# Patient Record
Sex: Female | Born: 1995 | Marital: Single
Health system: Northeastern US, Academic
[De-identification: ages and names within clinical notes are randomized; demographics above are authoritative.]

## PROBLEM LIST (undated history)

## (undated) HISTORY — PX: SHOULDER SURGERY: SHX246

## (undated) HISTORY — PX: KNEE SURGERY: SHX244

---
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
## 2014-09-12 IMAGING — CT CT ABD-PELV W/ CM
2 of 4 series · 17 of 46 positions shown, 19 images · IV contrast (APPLIED)
Comparison: None.

CLINICAL DATA: Right lower quadrant pain, nausea and vomiting

EXAM:
CT ABDOMEN AND PELVIS WITH CONTRAST
TECHNIQUE: Multidetector CT imaging of the abdomen and pelvis was performed
using the standard protocol following bolus administration of
intravenous contrast.
CONTRAST:  50mL OMNIPAQUE IOHEXOL 300 MG/ML SOLN, 100mL OMNIPAQUE
IOHEXOL 300 MG/ML SOLN

[Series 2: abd/pelvis 5.0 b31f · axial · 0.83mm/px · z∈[-561,-121]mm · 14 of 96 slices shown, 16 images]
[im 4/96  soft-tissue]
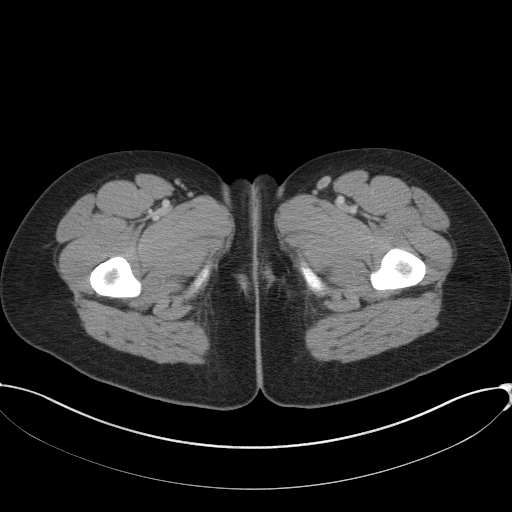
[im 4/96  bone]
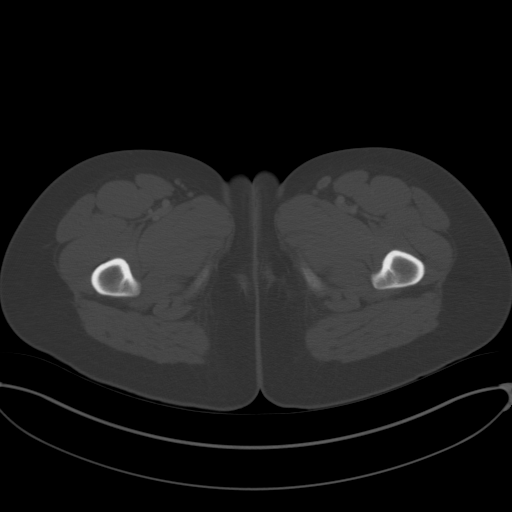
[im 12/96  soft-tissue]
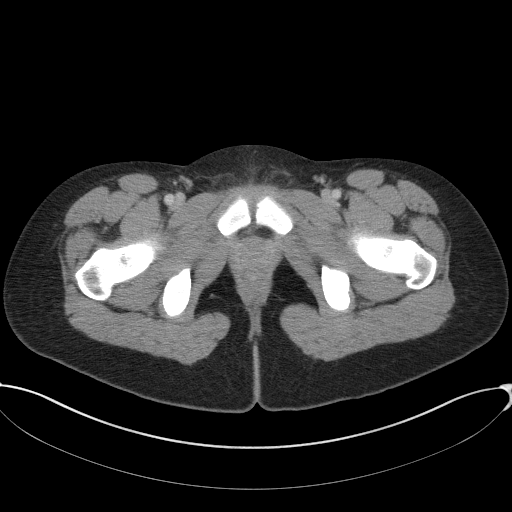
[im 20/96  soft-tissue]
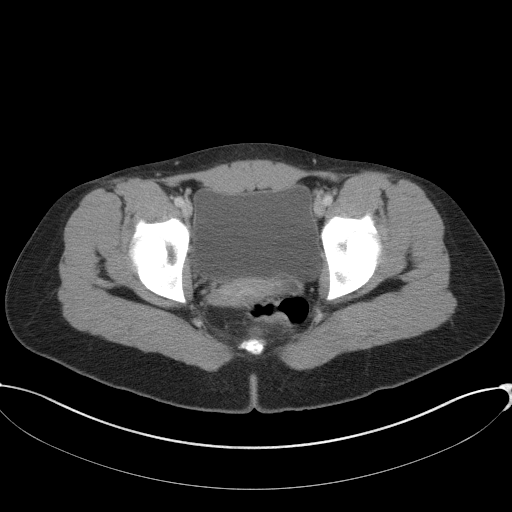
[im 24/96  soft-tissue]
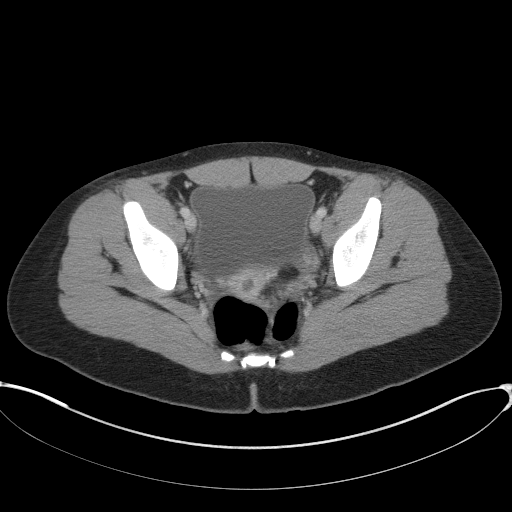
[im 32/96  soft-tissue]
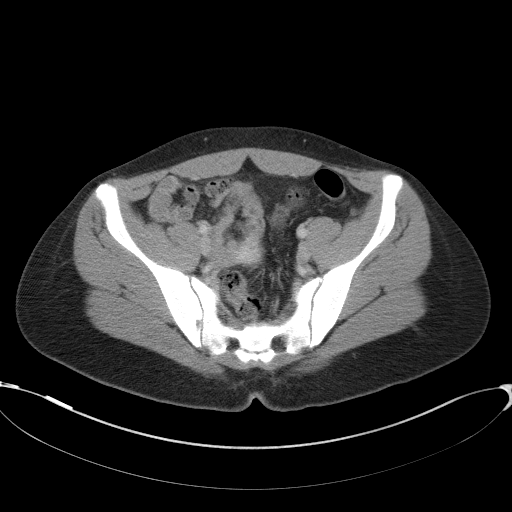
[im 40/96  soft-tissue]
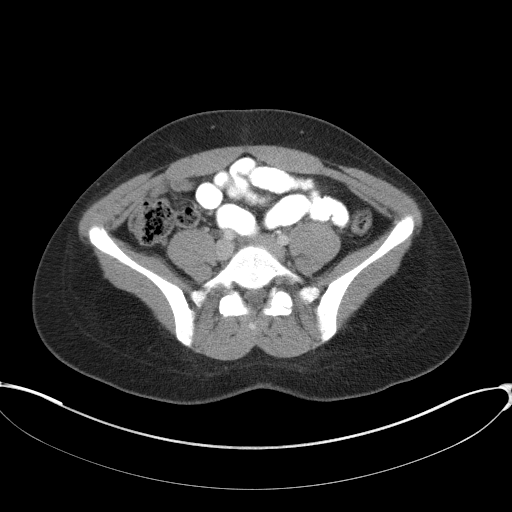
[im 44/96  soft-tissue]
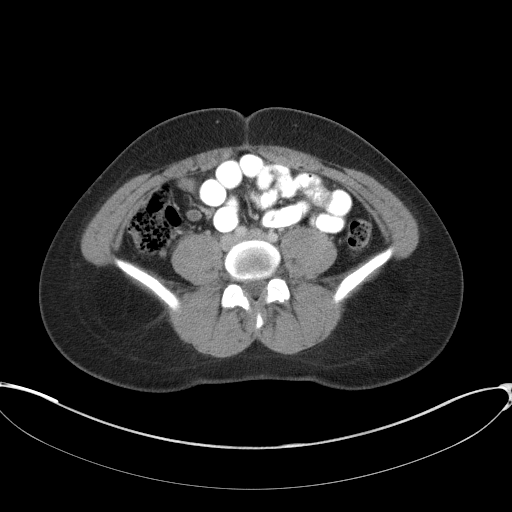
[im 52/96  soft-tissue]
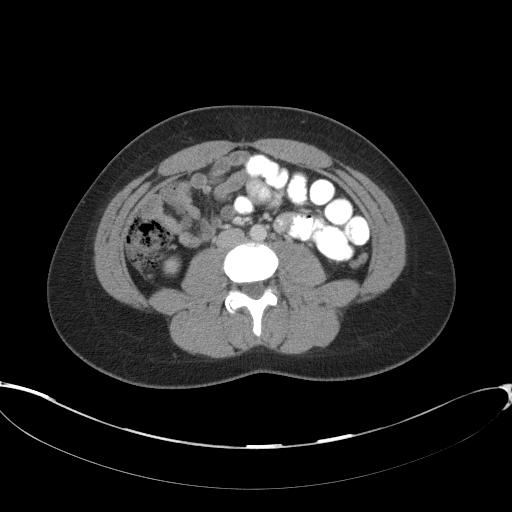
[im 56/96  soft-tissue]
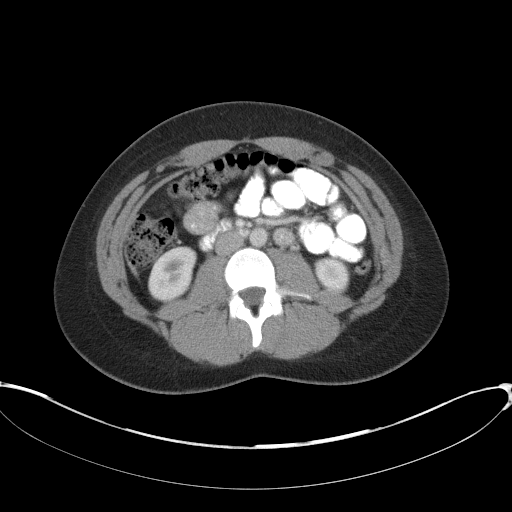
[im 56/96  bone]
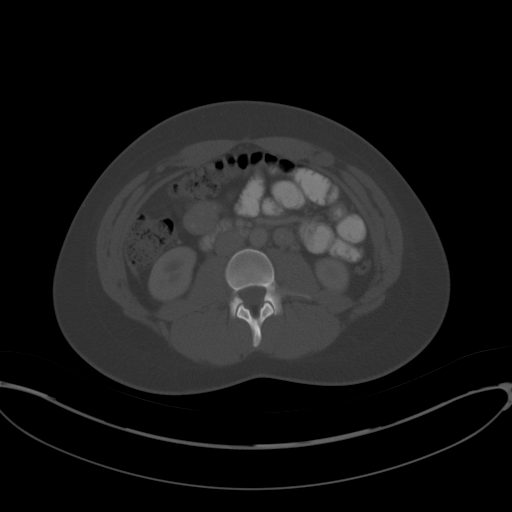
[im 64/96  soft-tissue]
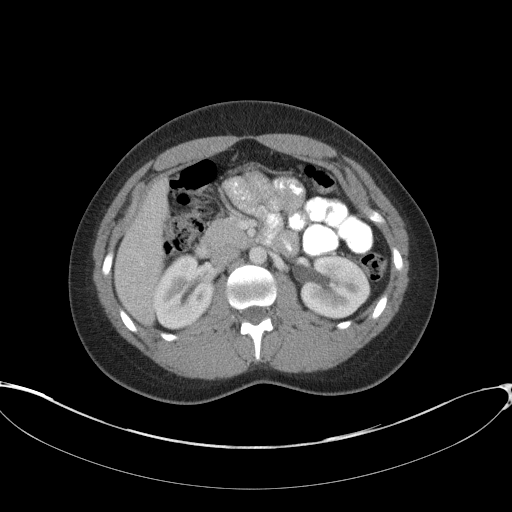
[im 72/96  soft-tissue]
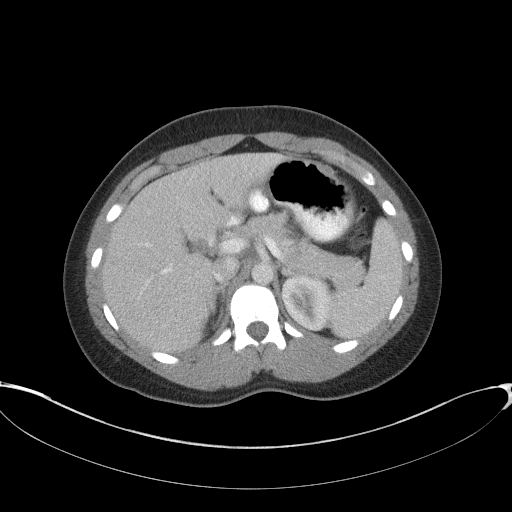
[im 76/96  soft-tissue]
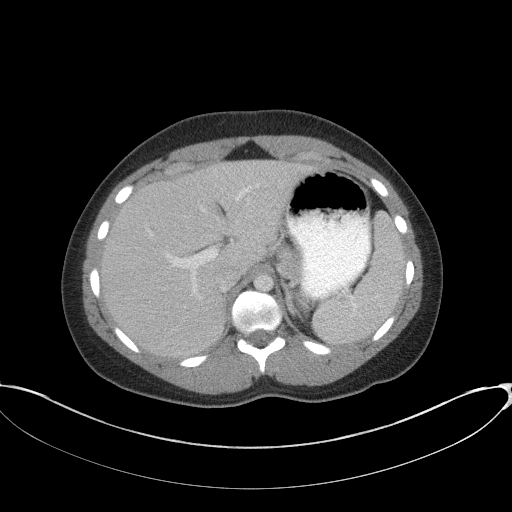
[im 84/96  soft-tissue]
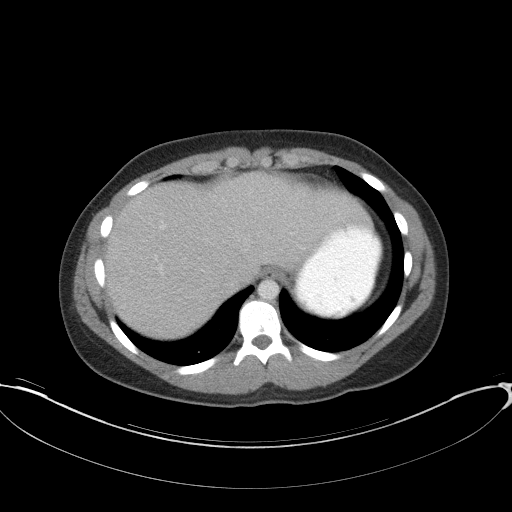
[im 92/96  soft-tissue]
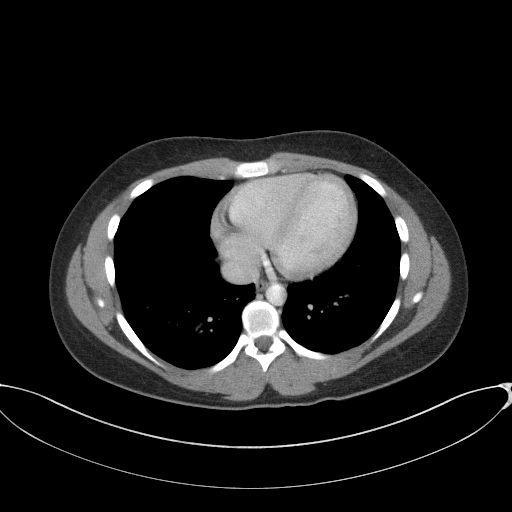

[Series 5: abd/pelvis 3.0 coronal · coronal · 0.86mm/px · 3 of 78 slices shown]
[im 26/78  soft-tissue]
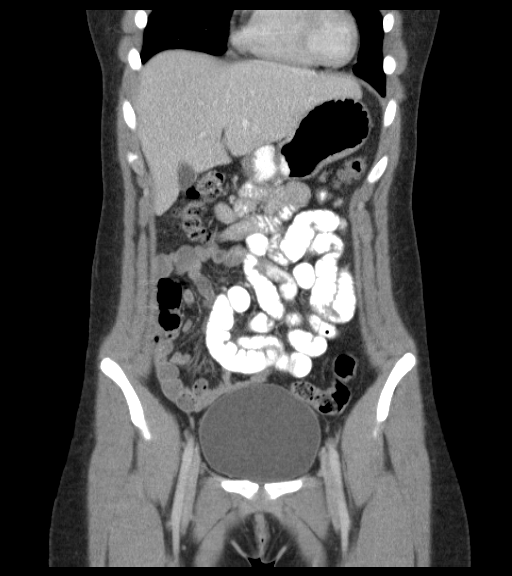
[im 35/78  soft-tissue]
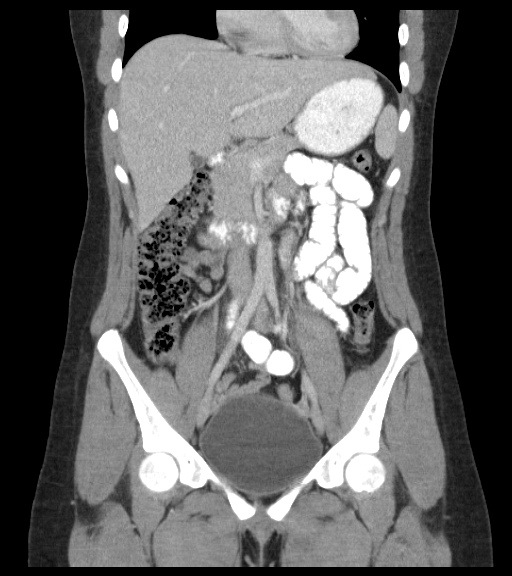
[im 43/78  soft-tissue]
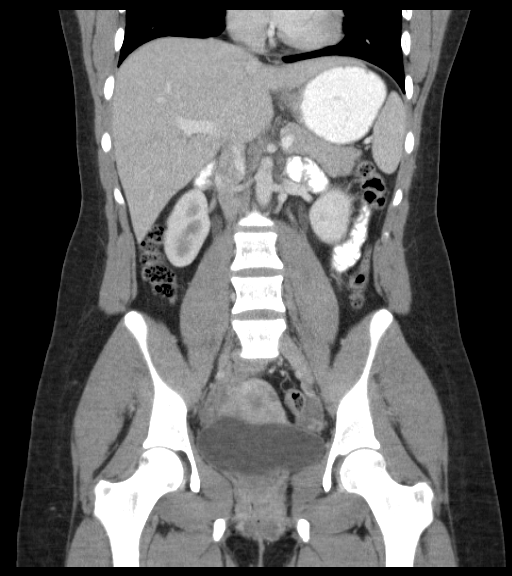

[17 of 46 positions shown; findings below may reference images not displayed]

FINDINGS: Lung bases are clear.  No pericardial fluid.

The liver, gallbladder, pancreas, spleen, adrenal glands, and
kidneys are normal.

The stomach, small bowel, appendix, and cecum are normal. The colon
rectosigmoid colon are normal.

Abdominal aorta is no caliber. No retroperitoneal periportal
lymphadenopathy.

No free fluid the pelvis. Uterus and ovaries are normal. Bladder is
normal. No pelvic lymphadenopathy. No aggressive osseous lesion.
IMPRESSION: Normal appendix.  No acute abdominal or pelvic trauma.

## 2016-01-31 ENCOUNTER — Ambulatory Visit: Admitting: Surgery

## 2016-01-31 ENCOUNTER — Ambulatory Visit

## 2016-01-31 NOTE — Progress Notes (Signed)
.  Progress Notes  .  Patient: [name] [name]  Provider: Biagio Borg  DOB: 1996-02-17 Age: 20 Y Sex: Female  .  PCP: Zonia Kief  MD  Date: 01/31/2016  .  --------------------------------------------------------------------------------  .  REASON FOR APPOINTMENT  .  1. She relates that the hallux nail of her left great toe has  fallen off twice and keeps regrowing. she denies any injury to  the toe  .  CURRENT MEDICATIONS  .  Taking Clonazepam 0.5 MG Tablet 1 tablet Once a day  Taking Sertraline HCl 25 mg Tablet 1 tablet Once a day  Medication List reviewed and reconciled with the patient  .  PAST MEDICAL HISTORY  .  Celiac disease - adherent to gluten free diet  Depression/anxiety - diagnosed during Freshman year of college,  never on medication  Left shoulder labral tear  Left knee injury - medial and lateral menisus tears, ACL tear  Chondromalacia patellae  Right ankle sprain  .  ALLERGIES  .  N.K.D.A.  .  SURGICAL HISTORY  .  Left ACL tear, lateral and medial meniscus injury repair. Feb  2013  Left shoulder labral repair 05/14  Left medial meniscus repair 06/15  .  FAMILY HISTORY  .  Mother: alive, diagnosed with Hypertension  Father: alive  Maternal Grand Mother: Depression  Maternal Grand Father: deceased, diagnosed with Hypertension,  Diabetes  Siblings: Sister - depression, anxiety Twin brothers, healthy  No known hx of autoimmune diseases.Denies early CAD, CVA, thyroid  d/o, substance abuseDenies h/o breast, colon, ovarian, skin  cancers.  .  SOCIAL HISTORY  .  .  Tobaccohistory:Never smoked.  .  Military HistoryHave you or anyone in your family served in the  [employer] in any capacity?Yes , If YES, describe whosibling.  .  Depression ScreeningDepression Screening FindingsNegative.  .  Sexual history Never sexually active.  .  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  .  Exercise: Was competing in [employer], exercising daily.  .  Alcohol Socially 1 glass, once/month.  .  Illicit drugs:  Denies.  .  Seat Belts: always.  .  Lives with: Parents.  .  Feels safe in her homeDenies guns in the homeGymnastics and  rowing while in high school.  Marland Kitchen  HOSPITALIZATION/MAJOR DIAGNOSTIC PROCEDURE  .  No Hospitalization History.  Marland Kitchen  VITAL SIGNS  .  Pain scale 1, Ht-in 66.34, Wt-lbs 165, BMI 26.36, BSA 1.87, Ht-cm  168.5, Wt-kg 74.84, Wt Change -3 lb.  Marland Kitchen  EXAMINATION  .  GENERAL: palpable pedal pulses and neurologic  sensation intact. skin is intact with no fissures, ulcerations,  or abscesses. there is a dry, scaly eruption of the left foot,  symptoms consistent with tinea pedis. there is subungal debris  and mild thickening of the left hallux nail. there is no  incurvation of the nail at this date.  .  ASSESSMENTS  .  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  Tinea pedis of left foot - B35.3  .  Foot pain, left - M79.672  .  TREATMENT  .  Dermatophytosis of nail  Start Econazole Nitrate Cream, 1 %, 1 application to affected  area, Externally, Once a day  Notes: inspected feet and provided patient with a prescription  for econazole cream. discussed with patient that her nail is  likely falling off because of the fungus under her nails.  instructed patient to apply econazole around the nails to treat  fungus. instructed patient on  proper home care.  .  FOLLOW UP  .  2 Months  .  Electronically signed by Victorio Palm , DPM on  02/03/2016 at 07:09 AM EDT  .  Document electronically signed by Biagio Borg

## 2016-01-31 NOTE — Progress Notes (Signed)
* * *        **[name] [name], [name] [name]**    --- ---    54 Y old Female, DOB: [birth year], External MRN: [id number]    Account Number: [id number]    2 VERA [name] [name], [id number]    Home: [phone]    Insurance: [employer] Gunning PPO Payer ID: PAPER    PCP: [name] Kief, MD Referring: Naida Sleight External Visit ID: [id number]    [facility]        * * *    01/31/2016  Progress Notes: [name] [name], DPM **CHN#:** [id number]    --- ---    ---        Current Medications    ---    Taking     * Clonazepam 0.5 MG Tablet 1 tablet Once a day    ---    * Sertraline HCl 25 mg Tablet 1 tablet Once a day    ---    * Medication List reviewed and reconciled with the patient    ---      Past Medical History    ---       Celiac disease - adherent to gluten free diet.        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        ---    Left shoulder labral tear.        ---    Left knee injury - medial and lateral menisus tears, ACL tear.        ---    Chondromalacia patellae.        ---    Right ankle sprain.        ---      Surgical History    ---      Left ACL tear, lateral and medial meniscus injury repair. Feb 2013    ---    Left shoulder labral repair 05/14    ---    Left medial meniscus repair 06/15    ---      Family History    ---      Mother: alive, diagnosed with Hypertension    ---    Father: alive    ---    Maternal Grand Mother: Depression    ---    Maternal Grand Father: deceased, diagnosed with Hypertension, Diabetes    ---    Siblings: Sister - depression, anxiety Twin brothers, healthy    ---    No known hx of autoimmune diseases.    Denies early CAD, CVA, thyroid d/o, substance abuse    Denies h/o breast, colon, ovarian, skin cancers.    ---      Social History    ---    Tobacco history: Never smoked.    Military History Have you or anyone in your family served in the [employer] in  any capacity? Yes, If YES, describe who sibling.    Depression Screening  Depression Screening Findings Negative.    Sexual history  Never sexually active.    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    Exercise: Was competing in [employer], exercising daily.    Alcohol  Socially 1 glass, once/month.    Illicit drugs: Denies.    Seat Belts: always.    Lives with: Parents.   Feels safe in her home    Denies guns in the home    Gymnastics and rowing while in high school.    ---  Allergies    ---      N.K.D.A.    ---    [name] [name] Verified]      Hospitalization/Major Diagnostic Procedure    ---      No Hospitalization History.    ---        Reason for Appointment    ---      1\. She relates that the hallux nail of her left great toe has fallen off  twice and keeps regrowing. she denies any injury to the toe    ---      Vital Signs    ---    Pain scale 1, Ht-in 66.34, Wt-lbs 165, BMI 26.36, BSA 1.87, Ht-cm 168.5, Wt-kg  74.84, Wt Change -3 lb.      Examination    ---     _GENERAL_ :    palpable pedal pulses and neurologic sensation intact. skin is intact with no  fissures, ulcerations, or abscesses. there is a dry, scaly eruption of the  left foot, symptoms consistent with tinea pedis. there is subungal debris and  mild thickening of the left hallux nail. there is no incurvation of the nail  at this date.          Assessments    ---    1\. 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 Tinea pedis of left foot - B35.3    ---    3\. Foot pain, left - Z61.096    ---      Treatment    ---       **1\. Dermatophytosis of nail**    Start Econazole Nitrate Cream, 1 %, 1 application to affected area,  Externally, Once a day    Notes: inspected feet and provided patient with a prescription for econazole  cream. discussed with patient that her nail is likely falling off because of  the fungus under her nails. instructed patient to apply econazole around the  nails to treat fungus. instructed patient on proper home care.    ---      Follow Up    ---    2 Months    Electronically signed  by [name] [name] , DPM on 02/03/2016 at 07:09 AM EDT    Sign off status: Completed        * * [facility]    [address]    [place], [place] [address]    Tel: [phone]    Fax: [phone]              * * *          Patient: [name] [name] DOB: 1995-10-16 Progress Note: [name] [name],  DPM 01/31/2016    ---    Note generated by eClinicalWorks EMR/PM Software ([email or web address])

## 2016-01-31 NOTE — Progress Notes (Signed)
* * *      **[name], [name]**    ------    53 Y old Female, DOB: Aug 24, 1996, External MRN: [id number]    Account Number: [id number]    [address], [id number]    Home: [phone]    Insurance: [name] [name]: PAPER    PCP: [name] Kief, MD Referring: [name] [name] External Visit [name]: [phone]    [facility]oint[facility]        * * *    01/31/2016 Progress Notes: Victorio Palm, DPM **CHN#:** [id number]    ------    ---      Current Medications    ---    Taking    * Clonazepam 0.5 MG Tablet 1 tablet Once a day    ---    * Sertraline HCl 25 mg Tablet 1 tablet Once a day    ---    * Medication List reviewed and reconciled with the patient    ---     Past Medical History    ---      Celiac disease - adherent to gluten free diet.        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        ---    Left shoulder labral tear.        ---    Left knee injury - medial and lateral menisus tears, ACL tear.        ---    Chondromalacia patellae.        ---    Right ankle sprain.        ---     Surgical History    ---     Left ACL tear, lateral and medial meniscus injury repair. Feb 2013    ---    Left shoulder labral repair 05/14    ---    Left medial meniscus repair 06/15    ---     Family History    ---     Mother: alive, diagnosed with Hypertension    ---    Father: alive    ---    Maternal Grand Mother: Depression    ---    Maternal Grand Father: deceased, diagnosed with Hypertension, Diabetes    ---    Siblings: Sister - depression, anxiety Twin brothers, healthy    ---    No known hx of autoimmune diseases.    Denies early CAD, CVA, thyroid d/o, substance abuse    Denies h/o breast, colon, ovarian, skin cancers.    ---     Social History    ---    Tobacco history: Never smoked.    Military History Have you or anyone in your family served in the [employer] in  any capacity? Yes, If YES, describe who sibling.    Depression Screening  Depression Screening Findings Negative.    Sexual history  Never sexually active.    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    Exercise: Was competing in [employer], exercising daily.    Alcohol  Socially 1 glass, once/month.    Illicit drugs: Denies.    Seat Belts: always.    Lives with: Parents.  Feels safe in her home    Denies guns in the home    Gymnastics and rowing while in high school.    ---     Allergies    ---     N.K.D.A.    ---    [  Allergies Verified]     Hospitalization/Major Diagnostic Procedure    ---     No Hospitalization History.    ---      Reason for Appointment    ---     1\. She relates that the hallux nail of her left great toe has fallen off  twice and keeps regrowing. she denies any injury to the toe    ---     Vital Signs    ---    Pain scale 1, Ht-in 66.34, Wt-lbs 165, BMI 26.36, BSA 1.87, Ht-cm 168.5, Wt-kg  74.84, Wt Change -3 lb.     Examination    ---     _GENERAL_ :    palpable pedal pulses and neurologic sensation intact. skin is intact with no  fissures, ulcerations, or abscesses. there is a dry, scaly eruption of the  left foot, symptoms consistent with tinea pedis. there is subungal debris and  mild thickening of the left hallux nail. there is no incurvation of the nail  at this date.         Assessments    ---    1\. 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 Tinea pedis of left foot - B35.3    ---    3\. Foot pain, left - Y78.295    ---     Treatment    ---      **1\. Dermatophytosis of nail**    Start Econazole Nitrate Cream, 1 %, 1 application to affected area,  Externally, Once a day    Notes: inspected feet and provided patient with a prescription for econazole  cream. discussed with patient that her nail is likely falling off because of  the fungus under her nails. instructed patient to apply econazole around the  nails to treat fungus. instructed patient on proper home care.    ---     Follow Up    ---    2 Months    Electronically signed  by Victorio Palm , DPM on 02/03/2016 at 07:09 AM EDT    Sign off status: Completed        * * [facility]    [address]    [place], [place] [address]    Tel: [phone]    Fax: [phone]              * * *         Patient: [name] [name], [name] [name] DOB: [birth year] Progress Note: Victorio Palm,  DPM 01/31/2016    ---    Note generated by eClinicalWorks EMR/PM Software ([email or web address])

## 2016-02-05 ENCOUNTER — Ambulatory Visit: Admitting: Internal Medicine

## 2016-02-05 ENCOUNTER — Ambulatory Visit

## 2016-02-05 NOTE — Progress Notes (Signed)
* * *        **  [name], [name]**    --- ---    64 Y old Female, DOB: [birth year]    [address] [place], [place] [address]    Home: [phone]    Provider: Zonia Kief, MD        * * *    Web Encounter    ---    Answered by   Dorothy Spark  Date: 02/05/2016         Time: 08:07 PM    Caller   Littie Ruud    --- ---            Reason   New Appointment Request            Message                      Appointment Type: Follow up        Facility: [facility]      Provider: Zonia Kief       Date Range  From: 02/14/2016    To: 02/18/2016      Day         First Preference: Any day as long as time request is accepted    Second Preference:      Time        First Preference: Morning (8-12AM)    Second Preference:      Preferred Method of Contact: eMail      Email: [email or web address]      Contact Number: [phone]      Reason Of Visit: anxiety                       Action Taken   [facility] 02/07/2016 10:33:06 AM > replied to pt on  portal                * * *         **eMessages**   From:   Harwood,Margaret    --- ---    Created:   2016-02-07 10:32:43    Sent:      Subject:   RE:New Appointment Request    Message:      [name],    We received your portal message that you wanted to schedule an appointment  with Dr Fidela Juneau. We booked you an appointment for 02/14/16 at 11:40AM with Dr Fidela Juneau.  If this appointment time or date does not work, please give our office a call.  Our telephone number is [phone].      Thank you,    [facility] in [place]                        ---          * * *          Patient: [name] [name], [name] [name] DOB: [birth year] Provider: Zonia Kief, MD  02/05/2016    ---    Note generated by eClinicalWorks EMR/PM Software ([email or web address])

## 2016-02-05 NOTE — Progress Notes (Signed)
* * *      **  [name], [name]**    ------    57 Y old Female, DOB: 04-07-1996    [address] [place], [place] [address]    Home: [phone]    Provider: Zonia Kief, MD        * * *    Web Encounter    ---    Answered by  Dorothy Spark Date: 02/05/2016       Time: 08:07 PM    Caller  Lacresha Mohar    ------            Reason  New Appointment Request            Message                     Appointment Type: Follow up        Facility: [facility]      Provider: Zonia Kief       Date Range  From: 02/14/2016    To: 02/18/2016      Day         First Preference: Any day as long as time request is accepted    Second Preference:      Time        First Preference: Morning (8-12AM)    Second Preference:      Preferred Method of Contact: eMail      Email: [email or web address]      Contact Number: [phone]      Reason Of Visit: anxiety                       Action Taken  [facility] 02/07/2016 10:33:06 AM > replied to pt on  portal                * * *         **eMessages**  From:  Harwood,Margaret    ------    Created:  2016-02-07 10:32:43    Sent:     Subject:  RE:New Appointment Request    Message:     [name],    We received your portal message that you wanted to schedule an appointment  with Dr Fidela Juneau. We booked you an appointment for 02/14/16 at 11:40AM with Dr Fidela Juneau.  If this appointment time or date does not work, please give our office a call.  Our telephone number is [phone].      Thank you,     [facility] in [place]                        ---          * * *         Patient: [name] [name], [name] [name] DOB: [birth year] Provider: Zonia Kief, MD  02/05/2016    ---    Note generated by eClinicalWorks EMR/PM Software ([email or web address])

## 2016-02-14 ENCOUNTER — Ambulatory Visit

## 2016-02-28 ENCOUNTER — Ambulatory Visit

## 2016-02-29 ENCOUNTER — Ambulatory Visit: Admitting: Family Medicine

## 2016-02-29 NOTE — Progress Notes (Signed)
* * *        **  [name] [name], [name] [name]**    --- ---    33 Y old Female, DOB: 1995/12/28    [address], [place], [place] [address]    Home: [phone]    Provider: Tharon Aquas, MD        * * *    Telephone Encounter    ---    Answered by   Albesa Seen  Date: 02/29/2016         Time: 12:12 PM    Message                      CALLED AND LEFT MESSAGE FOR PT TO RESCHEDULE APPT THAT WAS CANCELLED ON 03/20/16 DUE TO DR. Franchot Heidelberg BEING ON VACATION.        --- ---            Action Taken   [facility] 03/07/2016 11:57:37 AM > LVM Baxter,Leanne  03/09/2016 10:46:47 AM > Rescheduled with Dr. Octaviano Glow                * * *                ---          * * *          Patient: [name] [name], [name] [name] DOB: [birth year] Provider: Tharon Aquas, MD  02/29/2016    ---    Note generated by eClinicalWorks EMR/PM Software ([email or web address])

## 2016-02-29 NOTE — Progress Notes (Signed)
* * *        **  [name], [name]**    --- ---    33 Y old Female, DOB: 1995/12/28    [address], [place], [place] [address]    Home: [phone]    Provider: Tharon Aquas, MD        * * *    Telephone Encounter    ---    Answered by   Albesa Seen  Date: 02/29/2016         Time: 12:12 PM    Message                      CALLED AND LEFT MESSAGE FOR PT TO RESCHEDULE APPT THAT WAS CANCELLED ON 03/20/16 DUE TO DR. Franchot Heidelberg BEING ON VACATION.        --- ---            Action Taken   [facility] 03/07/2016 11:57:37 AM > LVM Baxter,Leanne  03/09/2016 10:46:47 AM > Rescheduled with Dr. Octaviano Glow                * * *                ---          * * *          Patient: [name] [name], [name] [name] DOB: [birth year] Provider: Tharon Aquas, MD  02/29/2016    ---    Note generated by eClinicalWorks EMR/PM Software ([email or web address])

## 2016-03-08 ENCOUNTER — Ambulatory Visit: Admitting: Internal Medicine

## 2016-03-08 ENCOUNTER — Ambulatory Visit

## 2016-03-08 NOTE — Progress Notes (Signed)
* * *        **  Zinda, Sarafina**    --- ---    27 Y old Female, DOB: November 26, 1995    [address] [place], [place], [place] [address]    Home: [phone]    Provider: Genia Plants, MD        * * *    Telephone Encounter    ---    Answered by   Genia Plants  Date: 03/08/2016         Time: 03:26 PM    Reason   MASSPAT    --- ---            Action Taken   Genia Plants , MD 03/08/2016 3:26:54 PM > Jill Side, just need  MASSPAT for patient. Thank you. Griffin,Colleen 03/08/2016 3:35:16 PM > scanned  into your docs. Javionna Leder , MD 03/08/2016 3:58:48 PM > thank you.  MASSPAT/PMP reviewed. Pt is due for clonazepam refill. Refilled today for  30-day supply - pt to see me in one month for f/u appt.                * * *                ---          * * *          Patient: Barradas, Sefora DOB: Jun 04, 1996 Provider: Genia Plants, MD  03/08/2016    ---    Note generated by eClinicalWorks EMR/PM Software ([email or web address])

## 2016-03-08 NOTE — Progress Notes (Signed)
.  Progress Notes  .  Patient: [name] [name], [name] [name]  Provider: Genia Plants  MD  .  DOB: Oct 30, 1995 Age: 20 Y Sex: Female  .  PCP: Zonia Kief  MD  Date: 03/08/2016  .  --------------------------------------------------------------------------------  .  REASON FOR APPOINTMENT  .  1. ANXIETY  .  2. Anxiety doesnt seem any better. not using clonazepam or  sertaline.  Marland Kitchen  HISTORY OF PRESENT ILLNESS  .  GENERAL:   Pt is here to follow up with regard to eczematous dermatitis and  increased anxiety episodes. She mentions that she has been taking  econazole ointment for dermatitis which has been helping with no  worsening flares.She reports she has been having anxiety episodes  once a week. She reports her symptoms include chest pressure and  shakes and heart racing and difficulty with breathing whenever  she gets anxiety attacks. Her bloodwork was checked with normal  bloodwork including thyroid function testing and CBC. She  mentions while she was taking the medication her symptoms were  the same. She reports she was taking sertraline every day along  with clonazepam every day. While she was taking medication, she  had only 1-2 attacks. She mentions the only reason she stopped  taking medication was because she was not coming in and did not  have appointment until now. She does report seeing a  counselor/therapist at [facility] in [place] but has not been  there in 2-3 weeks so she knows she will need to follow-up with  them. She says she had last panic episode this morning and cannot  attribute it to anything in particular (not really to stress from  school or from family or friends). No other concerns reported to  me.  .  CURRENT MEDICATIONS  .  Taking Econazole Nitrate 1 % Cream 1 application to affected area  Externally Once a day  Not-Taking/PRN Clonazepam 0.5 MG Tablet 1 tablet Orally Once a  day  Not-Taking/PRN Sertraline HCl 25 mg Tablet 1 tablet Orally Once a  day  Medication List reviewed and reconciled with the  patient  .  PAST MEDICAL HISTORY  .  Celiac disease - adherent to gluten free diet  Depression/anxiety - diagnosed during Freshman year of college,  never on medication  Left shoulder labral tear  Left knee injury - medial and lateral menisus tears, ACL tear  Chondromalacia patellae  Right ankle sprain  .  ALLERGIES  .  yes[Allergies Verified]  .  VITAL SIGNS  .  Pain scale 0, Ht-in 66.34, Wt-lbs 171, BMI 27.31, BP 110/80, HR  96, Ht-cm 168.5, Wt-kg 77.57, Wt Change 6 lb, Temp 98.1, Oxygen  sat % 99.  .  ASSESSMENTS  .  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  TREATMENT  .  Eczematous dermatitis  Refill Econazole Nitrate Cream, 1 %, 1 application to affected  area, Externally, Once a day, 30 day(s), 1 tube, Refills 3  .  .  Anxiety disorder due to general medical condition  with panic attack  Refill Clonazepam Tablet, 0.5 MG, 1 tablet, Orally, Once a day,  30 days, 30, Refills 0  Refill Sertraline HCl Tablet, 25 mg, 1 tablet, Orally, Once a  day, 30 days, 30, Refills 0  .  FOLLOW UP  .  4 Weeks, DR. SHROFF (Reason: ANNUAL PHYSICAL WITH DR. [facility] IN 4  WEEKS)  .  Electronically signed  by Genia Plants MD on  10/19/2016 at 04:15 PM EST  .  Document electronically signed by Genia Plants  MD  .

## 2016-03-08 NOTE — Progress Notes (Signed)
* * *        **[name] [name]**    --- ---    37 Y old Female, DOB: 03-11-1996, External MRN: [id number]    Account Number: [id number]    [address], [id number]    Home: [phone]    Insurance: [employer]ene Gunning PPO    PCP: Zonia Kief, MD Referring: Tharon Aquas    Appointment Facility: [facility]        * * *    03/08/2016  Progress Note: [name] [name] **CHN#:** [id number]    --- ---    ---        Current Medications    ---    Taking     * Econazole Nitrate 1 % Cream 1 application to affected area Externally Once a day    ---    Not-Taking/PRN    * Clonazepam 0.5 MG Tablet 1 tablet Orally Once a day    ---    * Sertraline HCl 25 mg Tablet 1 tablet Orally Once a day    ---    * Medication List reviewed and reconciled with the patient    ---      Past Medical History    ---       Celiac disease - adherent to gluten free diet.        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        ---    Left shoulder labral tear.        ---    Left knee injury - medial and lateral menisus tears, ACL tear.        ---    Chondromalacia patellae.        ---    Right ankle sprain.        ---        Reason for Appointment    ---      1\. ANXIETY    ---    2\. Anxiety doesnt seem any better. not using clonazepam or sertaline.    ---      Assessments    ---    1\. 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 Eczematous dermatitis**    Refill Econazole Nitrate Cream, 1 %, 1 application to affected area,  Externally, Once a day, 30 day(s), 1 tube, Refills 3    ---         **2\. Anxiety disorder due to general medical condition with panic attack**    Refill Clonazepam Tablet, 0.5 MG, 1 tablet, Orally, Once a day, 30 days, 30,  Refills 0    Refill Sertraline HCl Tablet, 25 mg, 1 tablet, Orally, Once a day, 30 days,  30, Refills 0      Follow Up     ---    4 Weeks, DR. Tamiya Colello (Reason: ANNUAL PHYSICAL WITH DR. Octaviano Glow IN 4 WEEKS)      History of Present Illness    ---     _GENERAL_ :    Pt is here to follow up with regard to eczematous dermatitis and increased  anxiety episodes. She mentions that she has been taking econazole ointment for  dermatitis which has been helping with no  worsening flares.    She reports she has been having anxiety episodes once a week. She reports her  symptoms include chest pressure and shakes and heart racing and difficulty  with breathing whenever she gets anxiety attacks. Her bloodwork was checked  with normal bloodwork including thyroid function testing and CBC. She mentions  while she was taking the medication her symptoms were the same. She reports  she was taking sertraline every day along with clonazepam every day. While she  was taking medication, she had only 1-2 attacks. She mentions the only reason  she stopped taking medication was because she was not coming in and did not  have appointment until now. She does report seeing a counselor/therapist at  [facility] in [place] but has not been there in 2-3 weeks so she knows she  will need to follow-up with them. She says she had last panic episode this  morning and cannot attribute it to anything in particular (not really to  stress from school or from family or friends). No other concerns reported to  me.      Vital Signs    ---    Pain scale 0, Ht-in 66.34, Wt-lbs 171, BMI 27.31, BP 110/80, HR 96, Ht-cm  168.5, Wt-kg 77.57, Wt Change 6 lb, Temp 98.1, Oxygen sat % 99.    Electronically signed by Genia Plants MD on 10/19/2016 at 04:15 PM EST    Sign off status: Completed        * * *        [facility]    [place], [place] [address]    Tel: [phone]    Fax: [phone]              * * *          Patient: [name], [name] DOB: 09/27/1995 Progress Note: [facility]  03/08/2016    ---    Note generated by eClinicalWorks EMR/PM Software  ([email or web address])

## 2016-03-08 NOTE — Progress Notes (Signed)
* * *        **[name], [name]**    --- ---    37 Y old Female, DOB: 03-11-1996, External MRN: [id number]    Account Number: [id number]    [address], [id number]    Home: [phone]    Insurance: [employer]ene Gunning PPO    PCP: Zonia Kief, MD Referring: Tharon Aquas    Appointment Facility: [facility]        * * *    03/08/2016  Progress Note: [name] [name] **CHN#:** [id number]    --- ---    ---        Current Medications    ---    Taking     * Econazole Nitrate 1 % Cream 1 application to affected area Externally Once a day    ---    Not-Taking/PRN    * Clonazepam 0.5 MG Tablet 1 tablet Orally Once a day    ---    * Sertraline HCl 25 mg Tablet 1 tablet Orally Once a day    ---    * Medication List reviewed and reconciled with the patient    ---      Past Medical History    ---       Celiac disease - adherent to gluten free diet.        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        ---    Left shoulder labral tear.        ---    Left knee injury - medial and lateral menisus tears, ACL tear.        ---    Chondromalacia patellae.        ---    Right ankle sprain.        ---        Reason for Appointment    ---      1\. ANXIETY    ---    2\. Anxiety doesnt seem any better. not using clonazepam or sertaline.    ---      Assessments    ---    1\. 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 Eczematous dermatitis**    Refill Econazole Nitrate Cream, 1 %, 1 application to affected area,  Externally, Once a day, 30 day(s), 1 tube, Refills 3    ---         **2\. Anxiety disorder due to general medical condition with panic attack**    Refill Clonazepam Tablet, 0.5 MG, 1 tablet, Orally, Once a day, 30 days, 30,  Refills 0    Refill Sertraline HCl Tablet, 25 mg, 1 tablet, Orally, Once a day, 30 days,  30, Refills 0      Follow Up     ---    4 Weeks, DR. Tamiya Colello (Reason: ANNUAL PHYSICAL WITH DR. Octaviano Glow IN 4 WEEKS)      History of Present Illness    ---     _GENERAL_ :    Pt is here to follow up with regard to eczematous dermatitis and increased  anxiety episodes. She mentions that she has been taking econazole ointment for  dermatitis which has been helping with no  worsening flares.    She reports she has been having anxiety episodes once a week. She reports her  symptoms include chest pressure and shakes and heart racing and difficulty  with breathing whenever she gets anxiety attacks. Her bloodwork was checked  with normal bloodwork including thyroid function testing and CBC. She mentions  while she was taking the medication her symptoms were the same. She reports  she was taking sertraline every day along with clonazepam every day. While she  was taking medication, she had only 1-2 attacks. She mentions the only reason  she stopped taking medication was because she was not coming in and did not  have appointment until now. She does report seeing a counselor/therapist at  [facility] in [place] but has not been there in 2-3 weeks so she knows she  will need to follow-up with them. She says she had last panic episode this  morning and cannot attribute it to anything in particular (not really to  stress from school or from family or friends). No other concerns reported to  me.      Vital Signs    ---    Pain scale 0, Ht-in 66.34, Wt-lbs 171, BMI 27.31, BP 110/80, HR 96, Ht-cm  168.5, Wt-kg 77.57, Wt Change 6 lb, Temp 98.1, Oxygen sat % 99.    Electronically signed by Genia Plants MD on 10/19/2016 at 04:15 PM EST    Sign off status: Completed        * * *        [facility]    [place], [place] [address]    Tel: [phone]    Fax: [phone]              * * *          Patient: [name] [name], [name] [name] DOB: 09/27/1995 Progress Note: [facility]  03/08/2016    ---    Note generated by eClinicalWorks EMR/PM Software  ([email or web address])

## 2016-03-21 ENCOUNTER — Ambulatory Visit: Admitting: Family Medicine

## 2016-04-04 ENCOUNTER — Ambulatory Visit: Admitting: Internal Medicine

## 2016-04-04 ENCOUNTER — Ambulatory Visit (HOSPITAL_BASED_OUTPATIENT_CLINIC_OR_DEPARTMENT_OTHER): Admitting: Internal Medicine

## 2016-04-11 ENCOUNTER — Ambulatory Visit: Admitting: Internal Medicine

## 2016-04-11 ENCOUNTER — Ambulatory Visit

## 2016-04-11 NOTE — Progress Notes (Signed)
* * *        **  [name], [name]**    --- ---    47 Y old Female, DOB: 14-Apr-1996    [address], [place], [place] [address]    Home: [phone]    Provider: Genia Plants, MD        * * *    Web Encounter    ---    Answered by   Dorothy Spark  Date: 04/11/2016         Time: 01:33 PM    Caller   Aryahna Moritz    --- ---            Reason   Reschedule Appointment Request            Message                      Rescheduled From:      Date: 04/11/2016  Time: 2:20 PM Reason: NP PREV DR NEY PHYSICAL      Provider: Genia Plants Facility:[facility]       ------------------------------------------       Rescheduled To:      Appointment Type: Consult            Facility: [facility]      Provider: Genia Plants       Date Range  From: 04/23/2016    To: 04/28/2016      Day         First Preference: Tuesday    Second Preference:Thursday      Time        First Preference: Morning (8-12AM)    Second Preference:Morning (8-12AM)      Preferred Method of Contact: eMail      Email: [email or web address]      Contact Number: [phone]      Reason Of Visit: NP PREV DR NEY PHYSICAL       Reason For Reschedule:                 Action Taken   [facility] 04/12/2016 9:42:10 AM > replied on portal                * * *         **eMessages**   From:   Willow Ora    --- ---    Created:   2016-04-12 09:42:07    Sent:      Subject:   [id number] Appointment Request    Message:      [name],    Dr Conley Simmonds' next available new patient appointment is 8/14. Please give our  office a call so we can help you schedule this appointment. Our telephone  number is [phone].    Thank you,    [facility] in [place]                        ---          * * *          Patient: [name] [name], [name] [name] DOB: 03/05/1996 Provider: Genia Plants, MD  04/11/2016    ---    Note generated by eClinicalWorks EMR/PM Software ([email or web address])

## 2016-04-11 NOTE — Progress Notes (Signed)
* * *        **  [name], [name]**    --- ---    47 Y old Female, DOB: 14-Apr-1996    [address], [place], [place] [address]    Home: [phone]    Provider: Genia Plants, MD        * * *    Web Encounter    ---    Answered by   Dorothy Spark  Date: 04/11/2016         Time: 01:33 PM    Caller   [name] [name]    --- ---            Reason   Reschedule Appointment Request            Message                      Rescheduled From:      Date: 04/11/2016  Time: 2:20 PM Reason: NP PREV DR NEY PHYSICAL      Provider: Genia Plants Facility:[facility]       ------------------------------------------       Rescheduled To:      Appointment Type: Consult            Facility: [facility]      Provider: Genia Plants       Date Range  From: 04/23/2016    To: 04/28/2016      Day         First Preference: Tuesday    Second Preference:Thursday      Time        First Preference: Morning (8-12AM)    Second Preference:Morning (8-12AM)      Preferred Method of Contact: eMail      Email: [email or web address]      Contact Number: [phone]      Reason Of Visit: NP PREV DR NEY PHYSICAL       Reason For Reschedule:                 Action Taken   [facility] 04/12/2016 9:42:10 AM > replied on portal                * * *         **eMessages**   From:   Willow Ora    --- ---    Created:   2016-04-12 09:42:07    Sent:      Subject:   [id number] Appointment Request    Message:      [name],    Dr Conley Simmonds' next available new patient appointment is 8/14. Please give our  office a call so we can help you schedule this appointment. Our telephone  number is [phone].    Thank you,    [facility] in [place]                        ---          * * *          Patient: [name] [name], [name] [name] DOB: 03/05/1996 Provider: Genia Plants, MD  04/11/2016    ---    Note generated by eClinicalWorks EMR/PM Software ([email or web address])

## 2016-04-11 NOTE — Progress Notes (Signed)
* * *        **  [name], [name]**    --- ---    13 Y old Female, DOB: 09-18-1996    [address], [place], [place] [address]    Home: [phone]    Provider: Genia Plants, MD        * * *    Web Encounter    ---    Answered by   Dorothy Spark  Date: 04/11/2016         Time: 01:33 PM    Caller   Arwyn Bines    --- ---            Reason   Cancel Appointment Request            Message                      Rescheduled From:      Date: 04/11/2016  Time: 2:20 PM Reason: NP PREV DR NEY PHYSICAL      Provider: Genia Plants Facility:[facility]       ------------------------------------------       Rescheduled To:      Appointment Type: Consult            Facility: [facility]      Provider: Genia Plants       Date Range  From: 04/23/2016    To: 04/28/2016      Day         First Preference: Tuesday    Second Preference:Thursday      Time        First Preference: Morning (8-12AM)    Second Preference:Morning (8-12AM)      Preferred Method of Contact: eMail      Email: [email or web address]      Contact Number: [phone]      Reason Of Visit: NP PREV DR NEY PHYSICAL       Reason For Reschedule:                 Action Taken   [facility] 04/12/2016 9:40:38 AM >                * * *                ---          * * *          Patient: [name], [name] DOB: 1996/05/16 Provider: Genia Plants, MD  04/11/2016    ---    Note generated by eClinicalWorks EMR/PM Software ([email or web address])

## 2016-04-11 NOTE — Progress Notes (Signed)
* * *        **  [name] [name], [name] [name]**    --- ---    13 Y old Female, DOB: 09-18-1996    [address], [place], [place] [address]    Home: [phone]    Provider: Genia Plants, MD        * * *    Web Encounter    ---    Answered by   Dorothy Spark  Date: 04/11/2016         Time: 01:33 PM    Caller   Arwyn Bines    --- ---            Reason   Cancel Appointment Request            Message                      Rescheduled From:      Date: 04/11/2016  Time: 2:20 PM Reason: NP PREV DR NEY PHYSICAL      Provider: Genia Plants Facility:[facility]       ------------------------------------------       Rescheduled To:      Appointment Type: Consult            Facility: [facility]      Provider: Genia Plants       Date Range  From: 04/23/2016    To: 04/28/2016      Day         First Preference: Tuesday    Second Preference:Thursday      Time        First Preference: Morning (8-12AM)    Second Preference:Morning (8-12AM)      Preferred Method of Contact: eMail      Email: [email or web address]      Contact Number: [phone]      Reason Of Visit: NP PREV DR NEY PHYSICAL       Reason For Reschedule:                 Action Taken   [facility] 04/12/2016 9:40:38 AM >                * * *                ---          * * *          Patient: [name], [name] DOB: 1996/05/16 Provider: Genia Plants, MD  04/11/2016    ---    Note generated by eClinicalWorks EMR/PM Software ([email or web address])

## 2016-05-01 ENCOUNTER — Ambulatory Visit

## 2017-07-31 ENCOUNTER — Ambulatory Visit: Admit: 2017-07-31 | Payer: PPO

## 2017-07-31 ENCOUNTER — Ambulatory Visit: Admitting: Gastroenterology

## 2017-07-31 ENCOUNTER — Ambulatory Visit

## 2017-07-31 LAB — HX HEM-ROUTINE
HX HCT: 40.7 % (ref 32.0–45.0)
HX HGB: 13.3 g/dL (ref 11.0–15.0)
HX MCH: 28.6 pg (ref 26.0–34.0)
HX MCHC: 32.7 g/dL (ref 32.0–36.0)
HX MCV: 87.5 fL (ref 80.0–98.0)
HX MPV: 9.7 fL (ref 9.1–11.7)
HX NRBC #: 0 10*3/uL
HX NUCLEATED RBC: 0 %
HX PLT: 247 10*3/uL (ref 150–400)
HX RBC BLOOD COUNT: 4.65 M/uL (ref 3.70–5.00)
HX RDW: 12.9 % (ref 11.5–14.5)
HX WBC: 8.6 10*3/uL (ref 4.0–11.0)

## 2017-07-31 LAB — HX CHEM-METABOLIC: HX THYROID STIMULATING HORMONE (TSH): 0.71 u[IU]/mL (ref 0.35–4.94)

## 2017-07-31 NOTE — Progress Notes (Signed)
.  Progress Notes  .  Patient: [name] [name]  Provider: Leticia Clas  MD  .DOB: 1996-05-24 Age: 21 Y Sex: Female  Supervising Provider:: Lucille Passy, MD  Date: 07/31/2017  .  PCP: Zonia Kief  MD  Date: 07/31/2017  .  --------------------------------------------------------------------------------  .  REASON FOR APPOINTMENT  .  1. CELIAC  .  HISTORY OF PRESENT ILLNESS  .  GENERAL:  [name] [name] is a 21 year old female  with a PMHx of Celiac Disease (dx in 2014 via antibodies and  endoscopy) who presents to the Gastroenterology Clinic at [facility] for new onset of symptoms. She reports at the time  of diagnosis she adhered to a strict gluten free diet. She has  not had break though symptoms. About 4-5 months ago she has  started to have loose stools with increased urgency. She has 4-6  bowel movements a day including night time wakening's denies  blood. She reports increased bloating as well. She denies any  skin rashes or difficulties breathing. Denies any weight changes.  She does not believe she has accidentally consumed gluten. She  denies any nausea, vomiting, epigastric pain or change in  medications.  .  CURRENT MEDICATIONS  .  None  .  PAST MEDICAL HISTORY  .  Celiac disease - adherent to gluten free diet  Depression/anxiety - diagnosed during Freshman year of college,  never on medication  Left shoulder labral tear  Left knee injury - medial and lateral menisus tears, ACL tear  Chondromalacia patellae  Right ankle sprain  .  ALLERGIES  .  N.K.D.A.  .  SURGICAL HISTORY  .  Left ACL tear, lateral and medial meniscus injury repair. Feb  2013  Left shoulder labral repair 05/14  Left medial meniscus repair 06/15  .  FAMILY HISTORY  .  Mother: alive, diagnosed with Hypertension  Father: alive  Maternal Grand Mother: Depression  Maternal Grand Father: deceased, diagnosed with Diabetes,  Hypertension  Siblings: Sister - depression, anxiety Twin brothers, healthy  No known hx of autoimmune  diseases.Denies early CAD, CVA, thyroid  d/o, substance abuseDenies h/o breast, colon, ovarian, skin  cancersDenies CRC, thyroid disease, or celiac disease.  .  SOCIAL HISTORY  .  .  Tobacco  history:Never smoked  .  Marland Kitchen  Military History  Have you or anyone in your family served in the [employer] in any  capacity?Yes  If YES, describe whosibling  .  Marland Kitchen  Depression Screening  Depression Screening FindingsNegative  .  Marland Kitchen  Sexual history  Never sexually active  .  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  .  .  Exercise: Was competing in [employer], exercising daily.  .  .  Alcohol  Socially 1 glass, once/month  .  Marland Kitchen  Illicit drugs: Denies.  .  .  Seat Belts: always.  .  .  Lives with: Parents.  .  Feels safe in her homeDenies guns in the homeGymnastics and  rowing while in high schoolDenies tobacco use, occational etoh,  or illicits [employer] major in bio and chem.  Marland Kitchen  HOSPITALIZATION/MAJOR DIAGNOSTIC PROCEDURE  .  Denies Past Hospitalization  .  REVIEW OF SYSTEMS  .  GI/Hepatology:  .  Constitutional    No recent weight change, fever, or fatigue .  Gastrointestinal    See HPI . Cardiovascular    No heart disease,  chest pain, angina, palpitations, shortness of breath, swelling  of feet, ankles, or  hands . Neurological    No frequent  headaches, lightheaded or dizziness . Respiratory    No chronic  cough/phlegm production, spitting up blood, shortness of breath,  asthma or wheezing . Endocrine    No glandular or hormone  problems, or thyroid disease, . Hematologic/Lymphatic    Not slow  to heal after cuts. No bleeding or bruising easily .  Integumentary (Skin, Breast)    No rash or itching, change in  color of skin, . Genitourinary    No frequent urination, burning  or painful urination, or blood in urine,  . Musculoskeletal    No  joint pain, muscle aches, or joint swelling .  Marland Kitchen  VITAL SIGNS  .  Ht-in 66.34, Wt-lbs 168, BMI 26.84, BP 120/78, HR 88, BSA 1.89,  O2 98%RA, Ht-cm 168.5, Wt-kg 76.2, Wt Change  -3 lb.  .  PHYSICAL EXAMINATION  .  GENERAL:  General Appearance:  no acute distress, alert and oriented.  Mood/Affect:  appropriate.  HEENT  Normocephalic, atraumatic. Sclera clear, conjunctiva pink.  Neck  supple.  Skin  There is no rash or other skin lesions noted.  Cardiovascular  RRR, no murmurs. There was no carotid bruit.  Lungs  Clear to auscultation; no wheezes, rhonchi or rales.  Abdomen  Soft, nontender, nondistended, no organmegaly.  Normoactive bowel sounds.  CVA tenderness  no.  Extremities  There was no clubbing, cyanosis or edema noted.  Psych  appropriate mood and affect.  .  ASSESSMENTS  .  Celiac disease - K90.0 (Primary)  .  Diarrhea, unspecified type - R19.7  .  Lower abdominal pain - R10.30  .  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  [name] [name] is a 21 year old female with a PMHx of Celiac  Disease (dx in 2014 via antibodies and endoscopy) who presents to  the Gastroenterology Clinic at [facility] for new onset  of symptoms. Her symptoms appear consistent with a flair of  Celiac. We will obtain antibody levels to test. Other diagnosis  are considered and remain on the differential including IBD,  SIBO, hyper/hypothyroidism, IBS-D and microscopic colitis. We  will start with celiac markers and SIBO testing. If these are  negative she will likely need a colonoscopy to further assess. We  will see her in clinic in 3 months for follow up.  .  TREATMENT  .  Celiac disease  LAB: Thyroid Stimulating Hormone (TSH)  Thyroid Stimulating Hormone (TSH)     0.71     (0.35 - 4.94 -  uIU/mL)  .  Marland Kitchen  LAB: Tissue Transglutam IgA  Tissue Transglutaminase IgA     4.9     (0.0 - 20.0 - units)  TTG IgA Interpretation     Negative     (Negative - )  .  Marland Kitchen  LAB: C-Reactive Protein, High sens (CRPHS)  .  LAB: CBC/DIFF with PLT (CBCWD)  .  Notes: 1. Please perform the breath test at home, if not covered  by your insurance please call to discuss 2. labs today3. Schedule  appointment with nutrition  4. Follow up in clinic in 3 months.  .  FOLLOW UP  .  3 Months  .  Marland Kitchen  Appointment Provider: Leticia Clas, MD  .  Electronically signed by Paulla Dolly MD, MD on  08/06/2017 at 11:16 AM EST  .  CONFIRMATORY SIGN OFF  KESAR,SUKEERTI , MD 08/06/2017 10:06:56 AM > Lucille Passy, MD 08/06/2017 11:16:08 AM > ,  I personally interviewed and examined the patient and both the fellow and I contributed to this electronic note. I agree with the history, exam, assessment and plan as detailed in this note and edited it as necessary.  .  Document electronically signed by Leticia Clas  MD  .

## 2017-07-31 NOTE — Progress Notes (Signed)
* * *        **[name] [name], [name] [name]**    --- ---    11 Y old Female, DOB: 29-Dec-1995, External MRN: [id number]    Account Number: [id number]    [address], [id number]    Home: [phone]    Insurance: [employer]ene Gunning PPO    PCP: Zonia Kief, MD Referring: [place] Ravens    [facility]        * * *    07/31/2017   **Appointment Provider:** Leticia Clas, MD **CHN#:** [id number]    --- ---      **Supervising Provider:** Lucille Passy, MD    ---        Reason for Appointment    ---      1\. CELIAC    ---      History of Present Illness    ---     _GENERAL_ :    [name] [name] is a 21 year old female with a PMHx of Celiac Disease (dx in  2014 via antibodies and endoscopy) who presents to the Gastroenterology Clinic  at [facility] for new onset of symptoms. She reports at the time of  diagnosis she adhered to a strict gluten free diet. She has not had break  though symptoms. About 4-5 months ago she has started to have loose stools  with increased urgency. She has 4-6 bowel movements a day including night time  wakening's denies blood. She reports increased bloating as well. She denies  any skin rashes or difficulties breathing. Denies any weight changes. She does  not believe she has accidentally consumed gluten. She denies any nausea,  vomiting, epigastric pain or change in medications.      Current Medications    ---      None    ---      Past Medical History    ---       Celiac disease - adherent to gluten free diet.        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        ---    Left shoulder labral tear.        ---    Left knee injury - medial and lateral menisus tears, ACL tear.        ---    Chondromalacia patellae.        ---    Right ankle sprain.        ---      Surgical History    ---      Left ACL tear, lateral and medial meniscus injury repair. Feb 2013    ---    Left shoulder labral repair 05/14    ---    Left medial  meniscus repair 06/15    ---      Family History    ---      Mother: alive, diagnosed with Hypertension    ---    Father: alive    ---    Maternal Grand Mother: Depression    ---    Maternal Grand Father: deceased, diagnosed with Diabetes, Hypertension    ---    Siblings: Sister - depression, anxiety Twin brothers, healthy    ---    No known hx of autoimmune diseases.    Denies early CAD, CVA, thyroid d/o, substance abuse    Denies h/o breast, colon, ovarian, skin cancers  Denies CRC, thyroid disease, or celiac disease.    ---      Social History    ---    Tobacco    history: _Never smoked_    Military History    Have you or anyone in your family served in the [employer] in any capacity?  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    Exercise: Was competing in [employer], exercising daily.    Alcohol    _Socially 1 glass, once/month_    Illicit drugs: Denies.    Seat Belts: always.    Lives with: Parents.   Feels safe in her home    Denies guns in the home    Gymnastics and rowing while in high school    Denies tobacco use, occational etoh, or illicits    [employer] major in bio and chem.    ---      Allergies    ---      N.K.D.A.    ---      Hospitalization/Major Diagnostic Procedure    ---      Denies Past Hospitalization    ---      Review of Systems    ---     _GI/Hepatology_ :    Constitutional No recent weight change, fever, or fatigue. Gastrointestinal  See HPI. Cardiovascular No heart disease, chest pain, angina, palpitations,  shortness of breath, swelling of feet, ankles, or hands. Neurological No  frequent headaches, lightheaded or dizziness. Respiratory No chronic  cough/phlegm production, spitting up blood, shortness of breath, asthma or  wheezing. Endocrine No glandular or hormone problems, or thyroid disease,.  Hematologic/Lymphatic Not  slow to heal after cuts. No bleeding or bruising  easily. Integumentary (Skin, Breast) No rash or itching, change in color of  skin,. Genitourinary No frequent urination, burning or painful urination, or  blood in urine, . Musculoskeletal No joint pain, muscle aches, or joint  swelling.          Vital Signs    ---    Ht-in 66.34, Wt-lbs 168, BMI 26.84, BP 120/78, HR 88, BSA 1.89, O2 98%RA, Ht-  cm 168.5, Wt-kg 76.2, Wt Change -3 lb.      Physical Examination    ---     _GENERAL_ :    General Appearance: no acute distress, alert and oriented.    Mood/Affect: appropriate.    HEENT Normocephalic, atraumatic. Sclera clear, conjunctiva pink.    Neck supple.    Skin There is no rash or other skin lesions noted.    Cardiovascular RRR, no murmurs. There was no carotid bruit.    Lungs Clear to auscultation; no wheezes, rhonchi or rales.    Abdomen Soft, nontender, nondistended, no organmegaly. Normoactive bowel  sounds.    CVA tenderness no.    Extremities There was no clubbing, cyanosis or edema noted.    Psych appropriate mood and affect.          Assessments    ---    1\. Celiac disease - K90.0 (Primary)    ---    2\. Diarrhea, unspecified type - R19.7    ---    3\. Lower abdominal pain - R10.30    ---    4\. 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 Her symptoms appear  consistent with a flair of Celiac. We will obtain antibody levels to test.  Other diagnosis are considered and remain on the differential including IBD,  SIBO, hyper/hypothyroidism, IBS-D and microscopic colitis. We will start with  celiac markers and SIBO testing. If these are negative she will likely need a  colonoscopy to further assess. We will see her in clinic in 3 months for  follow up.    ---      Treatment    ---       **1\.  Celiac disease**    _LAB: Thyroid Stimulating Hormone (TSH)_   Thyroid Stimulating Hormone (TSH)   0.71    0.35 - 4.94 - uIU/mL    --- --- --- ---    _LAB: Tissue Transglutam IgA_ Tissue Transglutaminase IgA  4.9    0.0 - 20.0 -  units    --- --- --- ---    TTG IgA Interpretation  Negative    Negative -    --- --- --- ---    _LAB: C-Reactive Protein, High sens (CRPHS)_    _LAB: CBC/DIFF with PLT (CBCWD)_    Notes: 1. Please perform the breath test at home, if not covered by your  insurance please call to discuss    2\. labs today    3\. Schedule appointment with nutrition    4\. Follow up in clinic in 3 months.      Follow Up    ---    3 Months    **Appointment Provider:** Leticia Clas, MD    Electronically signed by Paulla Dolly MD, MD on 08/06/2017 at 11:16 AM EST    Sign off status: Completed        * * *        GI Clinic    [address]    [place], [place] [address]    Tel: [phone]    Fax: [phone]              * * *          Patient: [name] [name], [name] [name] DOB: 1996/08/27 Progress Note: Leticia Clas,  MD 07/31/2017    ---    Note generated by eClinicalWorks EMR/PM Software ([email or web address])

## 2017-08-02 LAB — HX IMMUNOLOGY
HX C-REACTIVE PROTEIN (HIGH SENSITIVITY): 0.61 mg/L (ref 0.00–7.48)
HX TISSUE TRANSGLUTAMINASE IGA: 4.9 U (ref 0.0–20.0)
HX TTG IGA INTERPRETATION: NEGATIVE

## 2017-08-20 ENCOUNTER — Ambulatory Visit

## 2017-08-20 ENCOUNTER — Ambulatory Visit: Admitting: Gastroenterology

## 2017-08-20 NOTE — Progress Notes (Signed)
* * *        **  [name], [name]**    --- ---    70 Y old Female, DOB: November 07, 1995    [address], [place], [place] [address]    Home: [phone]    Provider: Lucille Passy, MD        * * *    Telephone Encounter    ---    Answered by   Sondra Barges  Date: 08/20/2017         Time: 01:00 PM    Caller   patient's mother    --- ---            Reason   call back from MD            Message                      Hi Dr. Revonda Standard, I got a voice mail from the patient's mother and she stated that the patient got her blood test results and would like to know if it shows a celiac flare up. She also wanted you to know that the breath test is not covered by insurance but the mother will pay out of pocket. Best contact number is [phone]. Thank you, Arline Asp                Action Taken   Situ,Cindy 08/20/2017 1:02:17 PM > Lucille Passy, MD  08/20/2017 3:58:00 PM > Ms. Stiverson is now an adult and does not have any  cognitive or other impairment that requires me to communicate with her mother.  I called Ms. Kommer and left a message on her phone that the tests showed  excellent control of her celiac disease.                * * *                ---          * * *          Patient: [name], [name] DOB: 05-08-1996 Provider: Lucille Passy,  MD 08/20/2017    ---    Note generated by eClinicalWorks EMR/PM Software ([email or web address])

## 2017-08-23 ENCOUNTER — Ambulatory Visit: Admit: 2017-08-23 | Payer: PPO

## 2017-08-23 ENCOUNTER — Ambulatory Visit: Admitting: Registered"

## 2017-08-23 MED ORDER — Rifaximin: 550 | Tablet | Freq: Three times a day (TID) | 0 refills | 0 days | Status: AC

## 2017-08-23 NOTE — Progress Notes (Signed)
.  Progress Notes  .  Patient: STRAYER, Ginnifer  Provider: Consuella Lose    .  DOB: 1995/10/12 Age: 21 Y Sex: Female  .  PCP: Zonia Kief  MD  Date: 08/23/2017  .  --------------------------------------------------------------------------------  .  HISTORY OF PRESENT ILLNESS  .  GENERAL:   Ms. Mericle is a 21 yo female referred to nutrition for  ?irritiable bowel syndrome with diarrhea; she presented to GI  clinic with months of diarrhea and abdominal bloating discomfort;  initially thought to be ?celiac flare, however TTG IGa levels  foudn to be WNL. She follows a strict gluten free diet and notes  no changes in her diet since the onset of her GI symptoms. Ms.  Kuck is currently in school- she commutes daily- lives at  home with her father; her brother, sister and law and nephew just  moved into the house as well. 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 At home, she  has her own section of the frdge and her own containers; she uses  parchment or tinfoil on shared cookware; she does not have her  own toaster over or cuttingboards- reports she rarely uses the  [employer]. She is not taking vitamin/mineral or herbal  supplements at this time. Diet is otherwise unrestricted. Live  home commute to school- brother/wife/ son in house.  .  Typical Intake:  B:  Egg with spinach, gluten free toast or bagel  OR gluten free oatmeal with berries or banana  OR gluten free bagel or toast with peanut butter  .  L:  chicken, meat, vegetables, rice .  D:  gluten free pasta, chicken, vegetables  .  Marland Kitchen   Vegetable preferences: zuchinni and summer squash, broccoli,  peppers ; does not cook with garlic and onions; eat sweet  potatoes rarely/on occassion; eats fruits more often in the  summer- occasionally has banana, berries and apples Beverages:  water, tea on occassion.  Marland Kitchen  PAST MEDICAL HISTORY  .  Celiac disease - adherent to gluten free diet  Depression/anxiety - diagnosed  during Freshman year of college,  never on medication  Left shoulder labral tear  Left knee injury - medial and lateral menisus tears, ACL tear  Chondromalacia patellae  Right ankle sprain  .  ALLERGIES  .  yes[Allergies Verified]  .  VITAL SIGNS  .  Ht-in 66.34, Wt-lbs 166, BMI 26.52, BP 118/77, HR 88, BSA 1.88,  O2 99%RA, Wt-kg 75.3When I compete I am at 158-161#.  .  ASSESSMENTS  .  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  Celiac disease - K90.0, Not currently adherent with diet. Check  labs  .  Altered GI function related to diarrhea, abdominal pain and hx of  celiac disease as evidneced by IBS with diarrhea.Estimated  Needs:MSJ (1530) x 1.5= 2295 kcal/day1.2g/kg pro= 90g/day.  .  TREATMENT  .  Irritable bowel syndrome with diarrhea  Notes: Ms. Adriance is a 21 yo female with hx of celiac disease  who is seen by me for assessment of ?dietary causes of new onset  diarrhea/abdominal pain (?IBS-D). Based on a thorough diet recall  and discuss, it does not seem that her diet as changed aroudn the  time of the onset of her symptoms; dietary assessment reveals  excellent compliance with gluten free diet and very minimal high  FODMAP foods in patient's diet. We did go over the high FODMAP  food list and patient  reports minimal intake of any high FODMAP  foods. I suggested she keep a food and GI symptom log x 1 week to  send to me for more indepth review. I did provide her with low  FODMAP materials , however, her diet is already quite low FODMAP  to begin with. I reinforced important areas where cross  contamination with gluten may occur in a household that is not  gluten free- urged her to get her own toaster overn and cutting  board. she will contact me with additional questions and will  mail me her food log for review at her convenience.time spent: 60  minutes individual counseling & coordination of careMONITOR: GI  symptoms, food and and GI symptom log, celiac serologies.  .  Electronically signed by  Consuella Lose , MS RD LDN  on 08/28/2017 at 04:06 PM EST  .  Document electronically signed by Consuella Lose    .

## 2017-08-23 NOTE — Progress Notes (Signed)
* * *        **  Woody, Shykeria**    --- ---    15 Y old Female, DOB: 1996-02-18    [address] [place], [place] [address]    Home: [phone]    Provider: Consuella Lose        * * *    Telephone Encounter    ---    Answered by   Consuella Lose  Date: 08/23/2017         Time: 01:08 PM    Message                      Hi Dr Revonda Standard            I wanted to let you know I saw [name] today for nutrition assessment in the setting of her new diarrhea/abdominal pain.  I saw that her celiac serologies came back negative; she is quite compliant on her gluten free diet- we did go over a few logistical things (like having her own toaster).  From an IBS standpoint, her diet is actually very clean and very consistent (she eats very similarly day to day); there are virtually no foods from the high FODMAP food list that I could isolate as issues.  She avoids all other typical IBS triggers as well.  I am going to have her keep a 3 day food and GI log to see if I can gather any connectiosn, but I don't feel confident that her symptoms are food related.  Let me know your thoughts and if you'd like me to provide any further counseling.  Teniyah is going to be in touch with me over email.        --- ---            Action Taken   Lucille Passy, MD 08/23/2017 2:26:17 PM > Lucille Passy, MD 08/23/2017 4:50:07 PM > Given this info, I will have her do a course of  Rifaximin for IBS-D            Refills  Start Rifaximin Tablet, 550 MG, Orally, 30 Tablet, 1 tablet, Three  times a day, 10 days, Refills=0    --- ---          * * *                ---          * * *          Patient: Mayhan, Valerie DOB: [birth year] Provider: Consuella Lose  08/23/2017    ---    Note generated by eClinicalWorks EMR/PM Software ([email or web address])

## 2017-08-23 NOTE — Progress Notes (Signed)
* * *        **[place], [place]**    --- ---    5 Y old Female, DOB: [birth year], External MRN: [id number]    Account Number: [id number]    [address] [place], [id number]    Home: [phone]    Insurance: [employer] PPO    PCP: Zonia Kief, MD Referring: Larena Sox    Appointment Facility: [facility]        * * *    08/23/2017  Progress Notes: Consuella Lose, MS, RD, LDN **CHN#:** [id number]    --- ---    ---        History of Present Illness    ---     _GENERAL_ :    Ms. Holecek is a 21 yo female referred to nutrition for ?irritiable bowel  syndrome with diarrhea; she presented to GI clinic with months of diarrhea and  abdominal bloating discomfort; initially thought to be ?celiac flare, however  TTG IGa levels foudn to be WNL. She follows a strict gluten free diet and  notes no changes in her diet since the onset of her GI symptoms. Ms.  Contino is currently in school- she commutes daily- lives at home with her  father; her brother, sister and law and nephew just moved into the house as  well. 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 At home, she has her own section of the frdge and her own  containers; she uses parchment or tinfoil on shared cookware; she does not  have her own toaster over or cuttingboards- reports she rarely uses the  [employer]. She is not taking vitamin/mineral or herbal supplements at this  time. Diet is otherwise unrestricted. Live home commute to school-  brother/wife/ son in house.     _Typical Intake_ :    B: Egg with spinach, gluten free toast or bagel    OR gluten free oatmeal with berries or banana    OR gluten free bagel or toast with peanut butter    . L: chicken, meat, vegetables, rice . D: gluten free pasta, chicken,  vegetables    .    Vegetable preferences: zuchinni and summer squash, broccoli, peppers ; does  not cook with garlic and onions; eat sweet potatoes rarely/on occassion; eats  fruits  more often in the summer- occasionally has banana, berries and apples    Beverages: water, tea on occassion.      Past Medical History    ---       Celiac disease - adherent to gluten free diet.        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        ---    Left shoulder labral tear.        ---    Left knee injury - medial and lateral menisus tears, ACL tear.        ---    Chondromalacia patellae.        ---    Right ankle sprain.        ---      Vital Signs    ---    Ht-in 66.34, Wt-lbs 166, BMI 26.52, BP 118/77, HR 88, BSA 1.88, O2 99%RA, Wt-  kg 75.3    When I compete I am at 158-161#.      Assessments    ---  1\. 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 Celiac disease - K90.0, Not currently adherent with diet. Check labs    ---      Altered GI function related to diarrhea, abdominal pain and hx of celiac  disease as evidneced by IBS with diarrhea.    Estimated Needs:    MSJ (1530) x 1.5= 2295 kcal/day    1.2g/kg pro= 90g/day.    ---      Treatment    ---       **1\. Irritable bowel syndrome with diarrhea**    Notes: Ms. Surita is a 21 yo female with hx of celiac disease who is seen  by me for assessment of ?dietary causes of new onset diarrhea/abdominal pain  (?IBS-D). Based on a thorough diet recall and discuss, it does not seem that  her diet as changed aroudn the time of the onset of her symptoms; dietary  assessment reveals excellent compliance with gluten free diet and very minimal  high FODMAP foods in patient's diet. We did go over the high FODMAP food list  and patient reports minimal intake of any high FODMAP foods. I suggested she  keep a food and GI symptom log x 1 week to send to me for more indepth review.  I did provide her with low FODMAP materials , however, her diet is already  quite low FODMAP to begin with. I reinforced important areas where cross  contamination with gluten may occur in a household that is not gluten  free-  urged her to get her own toaster overn and cutting board.    she will contact me with additional questions and will mail me her food log  for review at her convenience.    time spent: 60 minutes individual counseling & coordination of care    MONITOR: GI symptoms, food and and GI symptom log, celiac serologies.    ---    Electronically signed by Consuella Lose , MS RD LDN on 08/28/2017 at 04:06 PM  EST    Sign off status: Completed        * * *        [facility]    [address]    [place], [place] [address]    Tel: [phone]    Fax: [phone]              * * *          Patient: ABYGAIL, GALENO DOB: 1995/11/30 Progress Note: Consuella Lose,  MS, RD, LDN 08/23/2017    ---    Note generated by eClinicalWorks EMR/PM Software ([email or web address])

## 2017-08-30 ENCOUNTER — Ambulatory Visit: Admit: 2017-08-30 | Payer: PPO

## 2017-08-30 ENCOUNTER — Ambulatory Visit

## 2017-08-30 ENCOUNTER — Ambulatory Visit: Admitting: Infectious Disease

## 2017-08-30 MED ORDER — Malarone: 23 | Freq: Every day | 0 refills | 0 days | Status: AC

## 2017-08-30 MED ORDER — Azithromycin: 500 | 3 | Freq: Every day | 0 refills | 0 days | Status: AC

## 2017-08-30 MED ORDER — Loperamide A-D: 2 | 12 | 0 refills | 0 days | Status: AC

## 2017-08-30 NOTE — Progress Notes (Signed)
* * *        **[name], [name]**    --- ---    18 Y old Female, DOB: [birth year], External MRN: [id number]    Account Number: [id number]    [address], [id number]    Home: [phone]    Insurance: [employer] PPO Payer ID: PAPER    PCP: [name] Kief, MD Referring: [name] [name] External Visit ID: [id number]    Appointment Facility: Infectious Disease        * * *    08/30/2017  Travel Clinic: [name] Celeste, MD **CHN#:** [id number]    --- ---    ---        Current Medications    ---      None    ---      Past Medical History    ---       Celiac disease - adherent to gluten free diet.        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        ---    Left shoulder labral tear.        ---    Left knee injury - medial and lateral menisus tears, ACL tear.        ---    Chondromalacia patellae.        ---    Right ankle sprain.        ---      Surgical History    ---      Left ACL tear, lateral and medial meniscus injury repair. Feb 2013    ---    Left shoulder labral repair 05/14    ---    Left medial meniscus repair 06/15    ---      Social History    ---    Tobacco    Does patient currently smoke? _-_       Allergies    ---      N.K.D.A.    ---    [name] [name] Verified]        Reason for Appointment    ---      1\. NP/[facility]    ---      History of Present Illness    ---     _Travel_ :    Reason for Travel: -, Adventure, Install water filters.    Travel:  ** **   Country   Cities   Arrival Date   Length of Stay    Acccomodations    --- --- --- --- ---    [place]   [employer]   09/23/2017   14 days   Hotel                                                                                                                                                .  Vaccinations:  Vaccine   Yes   No   Date   Reaction   Documented Immunity    --- --- --- --- --- ---    Polio   x      12/15/1999         Tetanus/Diptheria/Pertussis   x      01/11/2005 05/20/2010          Measles/Mumps/Rubella   x      03/05/1996 12/15/1999         Hepatitis A      x            Hepatitis B   x      05-08-1995 4/17/199612/07/1995         Typhoid (oral)      x            Typhoid (injection)      x            Yellow Fever      x            Japanese Encephalitis      x            Rabies      x            Meningitis (Menactra 2-51yrs)   x      01/12/2006         Meningitis (Menomune (2-76yrs, >55)                  Influenza      x            Varicella   x      03/05/1996      had disease 2003    Other                                                    .    Immune Dysfunction (e.g. chemotherapy, steroids, HIV, etc.): -, no.    Hx of depression, anxiety, or other psychiatric disorders: None.    Hx of Varicella, Measles, Mumps, Rubella: Chickenpox in childhood.      Examination    ---     _ID: Travel Discussion_ :    Discussed the following items: Special medical problems DVT prevention  Swimming (schistosomiasis) Sun screen Blood borne pathogens/ STDs Viral  hepatitis Travelers diarrhea (prevention/treatment) Post-travel follow-up  services Bug bite prevention Dengue prevention Malaria prevention Road safety  Altitude sickness Rabies Traveler's Insurance Avian Influenza Motion sickness  Other: Ship broker provided to patient..          Assessments    ---    1\. 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 In her free time she will hike, see temples,  and go to a bird sanctuary. No active psych diagnoses or medical problems. Pt  will receive typhoid, flu, and hepatitis A vaccines today. E-Rxs for  azithromycin, loperamide, and malarone provided. although sam riep is thought  to be low risk for malaria, due to the nature of her trip and activities it  will involve, we did recommend malaria ppx.    I spent 30 minutes with  the patient, and of that approximately 30 minutes  were  spent counseling.    ---      Treatment    ---       **1\. Travel advice encounter**    Start Loperamide A-D Tablet, 2 MG, 1 tablet, Orally, after each loose bowel  movement not to exceed 8 tablets a day, 12, Refills 0    Start Azithromycin Tablet, 500 MG, 1 tablet, Orally, once a day for up to 3  days if diarrhea (may repeat if needed), 3, Refills 0    Start Malarone Tablet, 250-100 MG, as directed, Orally, every day starting 1  day prior to departure, every day while in country, and for 7 days upon  return, 23, Refills 0    ---      Immunization    ---      Hepatitis A : 1.0 (Dose No:1) (Route: Intramuscular) given by [name] [name] on Left Deltoid    ---    Typhoid (Injectible) : 0.5 cc (Route: Intramuscular) given by [name] [name] on Right Deltoid    ---    Influenza : 0.5 (Route: Intramuscular) given by [name] [name] on Left  Deltoid    ---      Procedure Codes    ---      13086 Hepatitis A    ---    57846 Typhoid (Injectible)    ---    [phone] Administration of Vaccine, First Vaccine    ---    [phone] Influenza    ---      Follow Up    ---    6 mos hep A #2    Electronically signed by [name] [name] , MD on 09/06/2017 at 01:10 PM EST    Sign off status: Completed        * * *        [facility]    [address]    [place], [place] [address]    Tel: [phone]    Fax: [phone]              * * *          Patient: [name] [name], [name] [name] DOB: 07/28/1996 Progress Note: [name] Celeste,  MD 08/30/2017    ---    Note generated by eClinicalWorks EMR/PM Software ([email or web address])

## 2017-08-30 NOTE — Progress Notes (Signed)
.  Progress Notes  .  Patient: [name] [name], [name] [name]  Provider: Candelaria Celeste    .  DOB: 11/05/1995 Age: 21 Y Sex: Female  .  PCP: Zonia Kief  MD  Date: 08/30/2017  .  --------------------------------------------------------------------------------  .  REASON FOR APPOINTMENT  .  1. NP/[facility]  .  HISTORY OF PRESENT ILLNESS  .  Travel :  Reason for Travel:  -, Adventure, Install water filters.  .  Travel:  Country  Cities  Arrival Date  Length of Stay  Acccomodations  .  [place]  [employer]  09/23/2017  14 days  Hotel  .  .  .  Vaccinations:  Vaccine  Yes  No  Date  Reaction  Documented Immunity  .  Polio  x  12/15/1999  .  Tetanus/Diptheria/Pertussis  x  01/11/2005 05/20/2010  .  Measles/Mumps/Rubella  x  03/05/1996 12/15/1999  .  Hepatitis A  x  .  Hepatitis B  x  1995/07/08 4/17/199612/07/1995  .  Typhoid (oral)  x  .  Typhoid (injection)  x  .  Yellow Fever  x  .  Japanese Encephalitis  x  .  Rabies  x  .  Meningitis (Menactra 2-45yrs)  x  01/12/2006  .  Meningitis (Menomune (2-49yrs, >55)  .  Influenza  x  .  Varicella  x  03/05/1996  had disease 2003  .  Other  .  .  .  Immune Dysfunction (e.g. chemotherapy, steroids,  HIV, etc.):  -, no.  Marland Kitchen  Hx of depression, anxiety, or other psychiatric  disorders:  None.  Marland Kitchen  Hx of Varicella, Measles, Mumps, Rubella:  Chickenpox in childhood.  .  CURRENT MEDICATIONS  .  None  .  PAST MEDICAL HISTORY  .  Celiac disease - adherent to gluten free diet  Depression/anxiety - diagnosed during Freshman year of college,  never on medication  Left shoulder labral tear  Left knee injury - medial and lateral menisus tears, ACL tear  Chondromalacia patellae  Right ankle sprain  .  ALLERGIES  .  N.K.D.A.  .  SURGICAL HISTORY  .  Left ACL tear, lateral and medial meniscus injury repair. Feb  2013  Left shoulder labral repair 05/14  Left medial meniscus repair 06/15  .  SOCIAL HISTORY  .  .  Tobacco  Does patient currently smoke? -  .  EXAMINATION  .  ID: Travel  Discussion:  Discussed the following items: Special medical problems DVT  prevention Swimming (schistosomiasis) Sun screen Blood borne  pathogens/ STDs Viral hepatitis Travelers diarrhea  (prevention/treatment) Post-travel follow-up services Bug bite  prevention Dengue prevention Malaria prevention Road safety  Altitude sickness Rabies Traveler's Insurance Avian Influenza  Motion sickness Other: Ship broker provided to  patient..  .  ASSESSMENTS  .  Travel advice encounter - Z71.89 (Primary)  .  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 In her free time she  will hike, see temples, and go to a bird sanctuary. No active  psych diagnoses or medical problems. Pt will receive typhoid,  flu, and hepatitis A vaccines today. E-Rxs for azithromycin,  loperamide, and malarone provided. although sam riep is thought  to be low risk for malaria, due to the nature of her trip and  activities it will involve, we did recommend malaria ppx. I spent  30 minutes with the patient, and of that  approximately 30 minutes  were spent counseling.  .  TREATMENT  .  Travel advice encounter  Start Loperamide A-D Tablet, 2 MG, 1 tablet, Orally, after each  loose bowel movement not to exceed 8 tablets a day, 12, Refills 0  Start Azithromycin Tablet, 500 MG, 1 tablet, Orally, once a day  for up to 3 days if diarrhea (may repeat if needed), 3, Refills 0  Start Malarone Tablet, 250-100 MG, as directed, Orally, every day  starting 1 day prior to departure, every day while in country,  and for 7 days upon return, 23, Refills 0  .  PROCEDURE CODES  .  [address] Hepatitis A  .  60454 Typhoid (Injectible)  .  09811 Administration of Vaccine, First Vaccine  .  91478 Influenza  .  FOLLOW UP  .  6 mos hep A #2  .  Electronically signed by Candelaria Celeste , MD on  09/06/2017 at 01:10 PM EST  .  Document electronically signed by Candelaria Celeste    .

## 2017-10-04 ENCOUNTER — Ambulatory Visit: Admitting: Gastroenterology

## 2017-10-04 ENCOUNTER — Ambulatory Visit

## 2017-10-04 NOTE — Progress Notes (Signed)
* * *        **  [name] [name], [name] [name]**    --- ---    76 Y old Female, DOB: Feb 22, 1996    [address] [place], [place] [address]    Home: [phone]    Provider: Lucille Passy, MD        * * *    Telephone Encounter    ---    Answered by   Sondra Barges  Date: 10/04/2017         Time: 01:41 PM    Reason   appt.    --- ---            Message                      called and left a VM informing patient we cancelled her appt. on 2/19 and to call back to reschedule her appt.                Action Taken   Situ,Cindy 10/04/2017 1:41:53 PM >                * * *                ---          * * *          Patient: [name] [name], [name] [name] DOB: 04-Apr-1996 Provider: Lucille Passy,  MD 10/04/2017    ---    Note generated by eClinicalWorks EMR/PM Software ([email or web address])

## 2018-01-28 ENCOUNTER — Ambulatory Visit

## 2018-07-02 ENCOUNTER — Ambulatory Visit

## 2018-07-02 ENCOUNTER — Ambulatory Visit: Admitting: Registered"

## 2018-07-02 NOTE — Progress Notes (Signed)
* * *        **  [name], [name]**    --- ---    58 Y old Female, DOB: 05-26-1996    [address] [place], [place] [address]    Home: [phone]    Provider: Consuella Lose        * * *    Web Encounter    ---    Answered by   Orson Gear  Date: 07/02/2018         Time: 03:32 PM    Caller   Feige Hursey    --- ---            Reason   New Appointment Request            Message                      Appointment Type: Follow up        Facility: [facility]      Provider: Consuella Lose       Date Range  From: 07/09/2018    To: 07/26/2018      Day         First Preference: Friday    Second Preference:Tuesday      Time        First Preference: Morning (8-12AM)    Second Preference:Afternoon(1-3PM)      Preferred Method of Contact: eMail      Email: [email or web address]      Contact Number: [phone]      Reason Of Visit: Stomach Pain / Diarrhea / Celiac Disease                       Action Taken                      Action - pt telephoned.  Left message                    * * *                ---          * * *          Patient: [name], [name] DOB: Mar 11, 1996 Provider: Consuella Lose  07/02/2018    ---    Note generated by eClinicalWorks EMR/PM Software ([email or web address])

## 2018-08-19 ENCOUNTER — Ambulatory Visit: Admit: 2018-08-19 | Payer: PPO

## 2018-08-19 ENCOUNTER — Ambulatory Visit: Admitting: Infectious Disease

## 2018-08-19 MED ORDER — Azithromycin: 500 | 6 | Freq: Every day | 0 refills | 0 days | Status: AC

## 2018-08-19 MED ORDER — Loperamide HCl: 2 | 30 | Freq: Four times a day (QID) | 0 refills | 0 days | Status: AC

## 2018-08-19 MED ORDER — Malarone: 25 | Freq: Every day | 0 refills | 0 days | Status: AC

## 2018-08-19 NOTE — Progress Notes (Signed)
* * *        **Kozicki, Marlene**    --- ---    65 Y old Female, DOB: July 08, 1996, External MRN: [id number]    Account Number: [id number]    [address] [place], [id number]    Home: [phone]    Insurance: [employer] PPO Payer ID: PAPER    PCP: Jonathon Bellows Referring: Jonathon Bellows External Visit ID: [id number]    Appointment Facility: Infectious Disease        * * *    08/19/2018  [facility]: Karle Starch, MD **CHN#:** [phone]    --- ---    ---         **Current Medications**    ---    Taking     * Azithromycin 500 MG Tablet 1 tablet Orally once a day for up to 3 days if diarrhea (may repeat if needed)    ---    * Loperamide A-D 2 MG Tablet 1 tablet Orally after each loose bowel movement not to exceed 8 tablets a day    ---    * Malarone 250-100 MG Tablet as directed Orally every day starting 1 day prior to departure, every day while in country, and for 7 days upon return    ---    * Medication List reviewed and reconciled with the patient    ---      Past Medical History    ---       Celiac disease - adherent to gluten free diet.        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        ---    Left shoulder labral tear.        ---    Left knee injury - medial and lateral menisus tears, ACL tear.        ---    Chondromalacia patellae.        ---    Right ankle sprain.        ---       **Surgical History**    ---       Left ACL tear, lateral and medial meniscus injury repair. Feb 2013    ---    Left shoulder labral repair 05/14    ---    Left medial meniscus repair 06/15    ---       **Family History**    ---       Mother: alive, diagnosed with Hypertension    ---    Father: alive    ---    Maternal Grand Mother: Depression    ---    Maternal Grand Father: deceased, Diabetes, Hypertension    ---    Siblings: Sister - depression, anxiety Twin brothers, healthy    ---    Non-Contributory    ---    No known hx of autoimmune diseases.    Denies early CAD, CVA,  thyroid d/o, substance abuse    Denies h/o breast, colon, ovarian, skin cancers    Denies CRC, thyroid disease, or celiac disease.    ---       **Social History**    ---    Tobacco Does patient currently smoke? -.   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    ---       **Allergies**    ---       N.K.D.A.    ---    Forrestine Him Verified]       **Hospitalization/Major Diagnostic Procedure**    ---       No Hospitalization History.    ---       **Review of Systems**    ---     _Infectious Disease_ :    Constitutional: no fever, no abnormal weight change, no night sweats, no  chills. HEENT: no diplopia, no darkened/blurred vision, no congestion, no sore  throat, no ear ache, no oral ulcers, no stiff neck. Cardiovascular: no chest  pain, no palpitations, no lightheadedness. Pulmonary: no cough, no shortness  of breath, no hemoptysis. GI: no abdominal pain, no diarrhea, no constipation,  no vomiting. GU: no polyuria, no nocturia, no hematuria, no dysuria. Skin: no  rashes, no lesions , no edema. Neurology: no headaches, no syncope, no  tremors. Psychology: no mood changes, no sleep disturbances. Musculoskeletal:  no weakness, no stumbling gait, no joint pain. Hematology: no easy bruising,  no abnormal bleeding. Lymphatic: no palpable lymph nodes. Endocrine: no  nocturia, no excessive thirst, no chronic fatigue, no sensitivity to heat or  cold.            **Reason for Appointment**    ---       1\. Seeking advice and vaccinations prior to traveling to [place]    ---       **History of Present Illness**    ---     _Travel_ :    Reason for Travel: -, [facility]. Travel:  ** **   Country   Cities    Arrival Date   Length of Stay   Acccomodations    --- --- --- --- ---    [place]   [facility]   09/22/18   14 days   Hotel                                                                                                                                                 . Vaccinations:  Vaccine   Yes   No   Date   Reaction   Documented Immunity    --- --- --- --- --- ---    Polio   x      1996         Tetanus/Diptheria/Pertussis   x2      2011         Measles/Mumps/Rubella   x2      2001         Hepatitis A   x      2018         Hepatitis B   x3  1996         Typhoid (oral)                  Typhoid (injection)   x      2018         Yellow Fever                  Japanese Encephalitis                  Rabies                  Meningitis (Menactra 2-4yrs)   x      2007         Meningitis (Menomune (2-70yrs, >55)                  Influenza                  Varicella   x      1997         Other                                                    . Pregnancy status: Not Pregnant, Last normal menses. Immune Dysfunction (e.g.  chemotherapy, steroids, HIV, etc.): -, no. Hx of depression, anxiety, or other  psychiatric disorders: None. Hx of Varicella, Measles, Mumps, Rubella: none.      **Examination**    ---     _ID: Travel Discussion_ :    Discussed the following items: Special medical problems DVT prevention  Swimming (schistosomiasis) Sun screen Blood borne pathogens/ STDs Viral  hepatitis Travelers diarrhea (prevention/treatment) Post-travel follow-up  services Bug bite prevention Dengue prevention Malaria prevention Road safety  Altitude sickness Rabies Traveler's Insurance Avian Influenza Motion sickness  Other:.    Medications suggested/offered, but not given :.    Reason medications/vaccinations not given: -.          **Assessments**    ---    1\. Travel advice encounter - Z71.84 (Primary)    ---    2\. Encounter for immunization - Z23    ---      I spent 30 minutes with the patient, and of that approximately 30 minutes  were spent counseling.    ---       **Treatment**    ---       **1\. Travel advice encounter**    Start Azithromycin Tablet, 500 MG, 1 tablet, Orally, once a day for up  to 3  days if diarrhea (may repeat if needed), 3 days, 6, Refills 0    Start Malarone Tablet, 250-100 MG, as directed, Orally, every day starting 1  day prior to departure, every day while in country, and for 7 days upon  return, 23 days, 25, Refills 0    Start Loperamide HCl Capsule, 2 MG, 1 capsule, Orally, Four times a day as  needed, not to exceed 8 mg per day, 3 days, 30, Refills 0    Notes: 1) Hep A: this is her 2nd Hep A    2) Td received in 2011.    ---      **Immunization**    ---       Hepatitis A : 1.0 (Dose No:2) (Route: Intramuscular) given by Orbie Pyo on Left Deltoid    ---  Influenza : 0.5 (Route: Intramuscular) given by Orbie Pyo on Left  Deltoid    ---       **Procedure Codes**    ---       [id number] Hepatitis A, Units: 2.00    ---    60454 Influenza    ---       **Follow Up**    ---    prn    Electronically signed by Karle Starch , MD on 08/19/2018 at 12:12 PM EST    Sign off status: Completed        * * *        Infectious Disease    [address] [place]    [place], [place] [address]    Tel: [phone]    Fax: [phone]              * * *          Patient: DENISE, WASHBURN DOB: 11-25-1995 Progress Note: Karle Starch, MD  08/19/2018    ---    Note generated by eClinicalWorks EMR/PM Software ([email or web address])

## 2018-08-19 NOTE — Progress Notes (Signed)
.  Progress Notes  .  Patient: [name] [name]  Provider: Karle Starch    .  DOB: [birth year] Age: 22 Y Sex: Female  .  PCP: Jonathon Bellows    Date: 08/19/2018  .  --------------------------------------------------------------------------------  .  REASON FOR APPOINTMENT  .  1. Seeking advice and vaccinations prior to traveling to [place]  .  HISTORY OF PRESENT ILLNESS  .  Travel :  Reason for Travel:  -, [facility].  Travel:  Country  Cities  Arrival Date  Length of Stay  Acccomodations  .  [place]  [facility]  09/22/18  14 days  Hotel  .  [facility]  Vaccinations:  Vaccine  Yes  No  Date  Reaction  Documented Immunity  .  Polio  x  1996  .  Tetanus/Diptheria/Pertussis  x2  2011  .  Measles/Mumps/Rubella  x2  2001  .  Hepatitis A  x  2018  .  Hepatitis B  x3  1996  .  Typhoid (oral)  .  Typhoid (injection)  x  2018  .  Yellow Fever  .  Japanese Encephalitis  .  Rabies  .  Meningitis (Menactra 2-76yrs)  x  2007  .  Meningitis (Menomune (2-36yrs, >55)  .  Influenza  .  Varicella  x  1997  .  Other  .  Marland Kitchen  Pregnancy status:  Not Pregnant, Last normal menses.  Immune Dysfunction (e.g. chemotherapy, steroids,  HIV, etc.):  -, no.  Hx of depression, anxiety, or other psychiatric  disorders:  None.  Hx of Varicella, Measles, Mumps, Rubella:  none.  .  CURRENT MEDICATIONS  .  Taking Azithromycin 500 MG Tablet 1 tablet Orally once a day for  up to 3 days if diarrhea (may repeat if needed)  Taking Loperamide A-D 2 MG Tablet 1 tablet Orally after each  loose bowel movement not to exceed 8 tablets a day  Taking Malarone 250-100 MG Tablet as directed Orally every day  starting 1 day prior to departure, every day while in country,  and for 7 days upon return  Medication List reviewed and reconciled with the patient  .  PAST MEDICAL HISTORY  .  Celiac disease - adherent to gluten free diet  Depression/anxiety - diagnosed during Freshman year of college,  never on medication  Left shoulder labral tear  Left knee injury - medial and  lateral menisus tears, ACL tear  Chondromalacia patellae  Right ankle sprain  .  ALLERGIES  .  N.K.D.A.  .  SURGICAL HISTORY  .  Left ACL tear, lateral and medial meniscus injury repair. Feb  2013  Left shoulder labral repair 05/14  Left medial meniscus repair 06/15  .  FAMILY HISTORY  .  Mother: alive, diagnosed with Hypertension  Father: alive  Maternal Grand Mother: Depression  Maternal Grand Father: deceased, Diabetes, Hypertension  Siblings: Sister - depression, anxiety Twin brothers, healthy  Non-Contributory  No known hx of autoimmune diseases.Denies early CAD, CVA, thyroid  d/o, substance abuseDenies h/o breast, colon, ovarian, skin  cancersDenies CRC, thyroid disease, or celiac disease.  .  SOCIAL HISTORY  .  .  TobaccoDoes patient currently smoke? -.  .  Feels safe in her homeDenies guns in the homeGymnastics and  rowing while in high schoolDenies tobacco use, occational etoh,  or illicits In [employer] major in bio and chem.  Marland Kitchen  HOSPITALIZATION/MAJOR DIAGNOSTIC PROCEDURE  .  No Hospitalization History.  Marland Kitchen  REVIEW OF SYSTEMS  .  Infectious Disease:  .  Constitutional:    no fever, no abnormal weight change, no night  sweats, no chills . HEENT:    no diplopia, no darkened/blurred  vision, no congestion, no sore throat, no ear ache, no oral  ulcers, no stiff neck . Cardiovascular:    no chest pain, no  palpitations, no lightheadedness . Pulmonary:    no cough, no  shortness of breath, no hemoptysis . GI:    no abdominal pain, no  diarrhea, no constipation, no vomiting . GU:    no polyuria, no  nocturia, no hematuria, no dysuria . Skin:    no rashes, no  lesions , no edema . Neurology:    no headaches, no syncope, no  tremors . Psychology:    no mood changes, no sleep disturbances .  Musculoskeletal:    no weakness, no stumbling gait, no joint pain  . Hematology:    no easy bruising, no abnormal bleeding .  Lymphatic:    no palpable lymph nodes . Endocrine:    no  nocturia, no excessive thirst, no  chronic fatigue, no sensitivity  to heat or cold .  Marland Kitchen  EXAMINATION  .  ID: Travel Discussion:  Discussed the following items: Special medical problems DVT  prevention Swimming (schistosomiasis) Sun screen Blood borne  pathogens/ STDs Viral hepatitis Travelers diarrhea  (prevention/treatment) Post-travel follow-up services Bug bite  prevention Dengue prevention Malaria prevention Road safety  Altitude sickness Rabies Traveler's Insurance Avian Influenza  Motion sickness Other:.  Medications suggested/offered, but not given:.  Reason medications/vaccinations not given:-.  .  ASSESSMENTS  .  Travel advice encounter - Z71.84 (Primary)  .  Encounter for immunization - Z23  .  I spent 30 minutes with the patient, and of that approximately 30  minutes were spent counseling.  .  TREATMENT  .  Travel advice encounter  Start Azithromycin Tablet, 500 MG, 1 tablet, Orally, once a day  for up to 3 days if diarrhea (may repeat if needed), 3 days, 6,  Refills 0  Start Malarone Tablet, 250-100 MG, as directed, Orally, every day  starting 1 day prior to departure, every day while in country,  and for 7 days upon return, 23 days, 25, Refills 0  Start Loperamide HCl Capsule, 2 MG, 1 capsule, Orally, Four times  a day as needed, not to exceed 8 mg per day, 3 days, 30, Refills  0  Notes: 1) Hep A: this is her 2nd Hep A2) Td received in 03-02-2010.  Marland Kitchen  PROCEDURE CODES  .  16109 Hepatitis A, Units: 2.00  .  90724 Influenza  .  FOLLOW UP  .  prn  .  Electronically signed by Karle Starch , MD on  08/19/2018 at 12:12 PM EST  .  Document electronically signed by Karle Starch    .

## 2019-05-29 ENCOUNTER — Ambulatory Visit

## 2019-07-22 ENCOUNTER — Ambulatory Visit: Admit: 2019-07-22 | Payer: PPO

## 2019-07-22 ENCOUNTER — Ambulatory Visit: Admitting: Gastroenterology

## 2019-07-22 NOTE — Progress Notes (Signed)
 .  Progress Notes  .  Patient: [name] [name]  Provider: Lucille PassyDOB: 1996/06/23 Age: 23 Y Sex: Female  .  PCP: [name] [name]    Date: 07/22/2019  .  --------------------------------------------------------------------------------  .  REASON FOR APPOINTMENT  .  1. CELIAC FU/LAST SEEN IN 2018  .  HISTORY OF PRESENT ILLNESS  .  Ambulatory Falls and Injury Prevention:  HPI  .  Have you experienced a fall in the past year?No , Is the patient  using assistive devices such as a cane or walker?No , Do you need  assistance with ambulation while at our facility?No ,  Interventionsnone, patient not a fall risk , ensured environment  free from obstacles and obstructions  .  Marland Kitchen  GENERAL:  [name] [name] is a 23 year old female  with a PMHx of Celiac Disease (dx in 2014 via antibodies and  endoscopy) who presents to the [facility] at [facility] for new onset of symptoms. She reports at the time  of diagnosis she adhered to a strict gluten free diet. She has  not had break though symptoms. About 4-5 months ago she has  started to have loose stools with increased urgency. She has 4-6  bowel movements a day including night time wakening's denies  blood. She reports increased bloating as well. She denies any  skin rashes or difficulties breathing. Denies any weight changes.  She does not believe she has accidentally consumed gluten. She  denies any nausea, vomiting, epigastric pain or change in  medications. [name] [name] returns after a 2 year hiatus. She  continues to have multiple bowel movements per day (7 per day,  watery). She denies blood in her stool, she does note nocturnal  symptoms, mild pain. She has had vomiting in the past 4-5 months  which is unusual. She continues on a gluten free diet.  .  CURRENT MEDICATIONS  .  None  .  PAST MEDICAL HISTORY  .  Celiac disease - adherent to gluten free diet  Depression/anxiety - diagnosed during Freshman year of college,  never on  medication  Left shoulder labral tear  Left knee injury - medial and lateral menisus tears, ACL tear  Chondromalacia patellae  Right ankle sprain  .  ALLERGIES  .  N.K.D.A.  .  SURGICAL HISTORY  .  Left ACL tear, lateral and medial meniscus injury repair. Feb  2013  Left shoulder labral repair 05/14  Left medial meniscus repair 06/15  .  FAMILY HISTORY  .  Mother: alive, diagnosed with Hypertension  Father: alive  Maternal Grand Mother: Depression  Maternal Grand Father: deceased, Diabetes mellitus without  mention of complication, type II or unspecified type, not stated  as uncontrolled, Hypertension  Siblings: Sister - depression, anxiety Twin brothers, healthy  No known hx of autoimmune diseases.Denies early CAD, CVA, thyroid  d/o, substance abuseDenies h/o breast, colon, ovarian, skin  cancersDenies CRC, thyroid disease, or celiac diseaseSister with  gastroparesis.  .  SOCIAL HISTORY  .  .  Tobaccohistory:Never smoked.  .  Military HistoryHave you or anyone in your family served in the  [employer] in any capacity?Yes , If YES, describe whosibling.  .  Depression ScreeningDepression Screening FindingsNegative.  .  Sexual history Never sexually active.  .  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  .  Exercise: Was competing in [employer], exercising daily.  .  Alcohol Socially 1 glass, once/month.  .  Abuse/NeglectDo you feel unsafe in  your relationships?No , Have  you ever been hit, kicked, punched or otherwise hurt by someone  in the past year? No , PlanPatient states they feel safe to  return home.  .  Illicit drugs: Denies.  .  Seat Belts: always.  .  Lives with: Parents.  .  Feels safe in her homeDenies guns in the homeGymnastics and  rowing while in high schoolDenies tobacco use, occational etoh,  or illicits [employer] major in bio and chem.  Marland Kitchen  REVIEW OF SYSTEMS  .  GI/Hepatology:  .  Constitutional    No recent weight change, fever, or fatigue,  Good general health lately .  Gastrointestinal    Positive,  Frequent diarrhea, Nausea or vomiting, Negative, Constipation,  painful bowl movements, Rectal bleeding or blood in stool .  Cardiovascular    No heart disease, chest pain, angina,  palpitations, shortness of breath, swelling of feet, ankles, or  hands . Neurological    Positive, Frequent headaches .  Respiratory    No chronic cough/phlegm production, spitting up  blood, shortness or breath, asthma or wheezing . Endocrine     Negative, Thyroid disease, Diabetes (Insulin), Diabetes  (Non-insulin) . Hematologic/Lymphatic    Not slow to heal after  cuts. No bleeding or bruising easily, anemia, phlebitis,  enlargement of glands, or past blood transfusion . Integumentary  (Skin, Breast)    No rash or itching, change in color of skin,  change in hair or nails, breast pain, breast lump, or breast  discharge . Genitourinary    No frequent urination, burning or  painful urination, blood in urine, incontinence, sexual  difficulty, or kidney stones . Musculoskeletal    No joint pain,  muscle aches, or joint swelling . HEENT    negative aphthae,  sores, eye pain .  Marland Kitchen  VITAL SIGNS  .  Pain scale 0, Ht-in 66.34, Wt-lbs 161.0, BMI 25.72, BP 135/79, HR  93, BSA 1.85, Wt-kg 73.03.  .  PHYSICAL EXAMINATION  .  GENERAL:  General Appearance:  no acute distress, alert and oriented.  Mood/Affect:  appropriate. HEENT  Normocephalic, atraumatic.  Sclera clear, conjunctiva pink. Neck  supple. Skin  There is no  rash or other skin lesions noted. Cardiovascular  RRR, no  murmurs. There was no carotid bruit. Lungs  Clear to  auscultation; no wheezes, rhonchi or rales. Abdomen  Soft,  epigastric tenderness, nondistended, no organmegaly. Normoactive  bowel sounds. Extremities  There was no clubbing, cyanosis or  edema noted. Psych  appropriate mood and affect.  .  ASSESSMENTS  .  Celiac disease - K90.0 (Primary), Not currently adherent with  diet. Check labs  .  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  [name] [name] has been suffering from several loose stools per  day which wake her up early in the morning. The diagnosis of  celiac disease puts her at risk for microscopic colitis and IBS-D  (SIBO variant). I will have her take rifaximin and see if this  resolves her symptoms. If it does not then I would like to do a  colonoscopy to rule out microscopic colitis and inflammatory  bowel disease. I will check her celiac serologic markers if these  are abnormal then I will refer her to nutrition and also obtain  iron studies. She does need to have a DEXA scan done at some  point in time. I have ordered a Prevnar vaccination and she will  also need to have Pneumovax  as patients with celiac disease are  at increased risk for pneumococcal illness.  .  TREATMENT  .  Celiac disease  LAB: Tissue Transglutam IgA (TTGA)  .  LAB: Tissue Transglut IgG (TTGG)  .  Marland Kitchen  Irritable bowel syndrome with diarrhea  Start Rifaximin Tablet, 550 MG, 1 tablet, Orally, Three times a  day, 14 days, 42 Tablet  .  PROCEDURE CODES  .  Prevnar 13  .  FOLLOW UP  .  3 Months  .  Electronically signed by Paulla Dolly , MD on  07/22/2019 at 05:31 PM EDT  .  Document electronically signed by [name] [name]

## 2019-07-22 NOTE — Progress Notes (Signed)
 * * *    [name] [name], [name] [name] **DOB:** October 03, 1995 (23 yo F) **Acc No.** [id number] **DOS:**  07/22/2019    ---       [name] [name], Kahdijah**    ------    47 Y old Female, DOB: 1996-03-01, External MRN: [id number]    Account Number: [id number]    [address] [place], [id number]    Home: [phone]    Insurance: Radene Gunning PPO    PCP: Jonathon Bellows Referring: Jonathon Bellows    Appointment Facility[facility]        * * *    07/22/2019 Progress Notes: Lucille Passy, MD **CHN#:** [id number]    ------    ---       **Reason for Appointment**    ---      1\. CELIAC FU/LAST SEEN IN 2018    ---      **History of Present Illness**    ---     _Ambulatory Falls and Injury Prevention_ :    HPI Have you experienced a fall in the past year? No, Is the patient using  assistive devices such as a cane or walker? No, Do you need assistance with  ambulation while at our facility? No, Interventions none, patient not a fall  risk , ensured environment free from obstacles and obstructions.    _GENERAL_ :    Ms. [name] [name] is a 23 year old female with a PMHx of Celiac Disease (dx in  2014 via antibodies and endoscopy) who presents to the Gastroenterology Clinic  at [facility] for new onset of symptoms. She reports at the time of  diagnosis she adhered to a strict gluten free diet. She has not had break  though symptoms. About 4-5 months ago she has started to have loose stools  with increased urgency. She has 4-6 bowel movements a day including night time  wakening's denies blood. She reports increased bloating as well. She denies  any skin rashes or difficulties breathing. Denies any weight changes. She does  not believe she has accidentally consumed gluten. She denies any nausea,  vomiting, epigastric pain or change in medications.    [name] returns after a 2 year hiatus. She continues to have multiple  bowel movements per day (7 per day, watery). She denies blood in her stool,  she does note nocturnal symptoms,  mild pain. She has had vomiting in the past  4-5 months which is unusual.    She continues on a gluten free diet.      **Current Medications**    ---      None    ---      **Past Medical History**    ---      Celiac disease - adherent to gluten free diet.        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        ---    Left shoulder labral tear.        ---    Left knee injury - medial and lateral menisus tears, ACL tear.        ---    Chondromalacia patellae.        ---    Right ankle sprain.        ---      **Surgical History**    ---      Left ACL tear, lateral and medial meniscus injury repair. Feb 2013    ---  Left shoulder labral repair 05/14    ---    Left medial meniscus repair 06/15    ---      **Family History**    ---      Mother: alive, diagnosed with Hypertension    ---    Father: alive    ---    Maternal Grand Mother: Depression    ---    Maternal Grand Father: deceased, Diabetes mellitus without mention of  complication, type II or unspecified type, not stated as uncontrolled,  Hypertension    ---    Siblings: Sister - depression, anxiety Twin brothers, healthy    ---    No known hx of autoimmune diseases.    Denies early CAD, CVA, thyroid d/o, substance abuse    Denies h/o breast, colon, ovarian, skin cancers    Denies CRC, thyroid disease, or celiac disease    Sister with gastroparesis.    ---      **Social History**    ---    Tobacco  history: Never smoked.    Military History  Have you or anyone in your family served in the [employer] in  any capacity? Yes, If YES, describe who sibling.    Depression Screening  Depression Screening Findings Negative.    Sexual history  Never sexually active.    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    Exercise: Was competing in [employer], exercising daily.    Alcohol  Socially 1 glass, once/month.    Abuse/Neglect  Do you feel unsafe in your relationships? No, Have you ever  been hit, kicked,  punched or otherwise hurt by someone in the past year? No,  Plan Patient states they feel safe to return home.    Illicit drugs: Denies.    Seat Belts: always.    Lives with: Parents.  Feels safe in her home    Denies guns in the home    Gymnastics and rowing while in high school    Denies tobacco use, occational etoh, or illicits    In [employer] major in bio and chem.    ---      **Allergies**    ---      N.K.D.A.    ---      **Review of Systems**    ---     _GI/Hepatology_ :    Constitutional No recent weight change, fever, or fatigue, Good general health  lately. Gastrointestinal Positive, Frequent diarrhea, Nausea or vomiting,  Negative, Constipation, painful bowl movements, Rectal bleeding or blood in  stool. Cardiovascular No heart disease, chest pain, angina, palpitations,  shortness of breath, swelling of feet, ankles, or hands. Neurological  Positive, Frequent headaches. Respiratory No chronic cough/phlegm production,  spitting up blood, shortness or breath, asthma or wheezing. Endocrine  Negative, Thyroid disease, Diabetes (Insulin), Diabetes (Non-insulin).  Hematologic/Lymphatic Not slow to heal after cuts. No bleeding or bruising  easily, anemia, phlebitis, enlargement of glands, or past blood transfusion.  Integumentary (Skin, Breast) No rash or itching, change in color of skin,  change in hair or nails, breast pain, breast lump, or breast discharge.  Genitourinary No frequent urination, burning or painful urination, blood in  urine, incontinence, sexual difficulty, or kidney stones. Musculoskeletal No  joint pain, muscle aches, or joint swelling. HEENT negative aphthae, sores,  eye pain.         **Vital Signs**    ---    Pain scale 0, Ht-in 66.34, Wt-lbs 161.0, BMI 25.72, BP 135/79, HR 93, BSA  1.85, Wt-kg 73.03.      **Physical Examination**    ---     _GENERAL_ :    General Appearance: no acute distress, alert and oriented. Mood/Affect:  appropriate. HEENT Normocephalic, atraumatic.  Sclera clear, conjunctiva pink.  Neck supple. Skin There is no rash or other skin lesions noted. Cardiovascular  RRR, no murmurs. There was no carotid bruit. Lungs Clear to auscultation; no  wheezes, rhonchi or rales. Abdomen Soft, epigastric tenderness, nondistended,  no organmegaly. Normoactive bowel sounds. Extremities There was no clubbing,  cyanosis or edema noted. Psych appropriate mood and affect.         **Assessments**    ---    1\. Celiac disease - K90.0 (Primary), Not currently adherent with diet. Check  labs    ---    2\. 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 Scheunemann has been suffering from several loose stools per day which  wake her up early in the morning. The diagnosis of celiac disease puts her at  risk for microscopic colitis and IBS-D (SIBO variant). I will have her take  rifaximin and see if this resolves her symptoms. If it does not then I would  like to do a colonoscopy to rule out microscopic colitis and inflammatory  bowel disease. I will check her celiac serologic markers if these are abnormal  then I will refer her to nutrition and also obtain iron studies. She does need  to have a DEXA scan done at some point in time. I have ordered a Prevnar  vaccination and she will also need to have Pneumovax as patients with celiac  disease are at increased risk for pneumococcal illness.    ---      **Treatment**    ---      **1\. Celiac disease**    _LAB: Tissue Transglutam IgA (TTGA)_    _LAB: Tissue Transglut IgG (TTGG)_    ---         **2\. Irritable bowel syndrome with diarrhea**    Start Rifaximin Tablet, 550 MG, 1 tablet, Orally, Three times a day, 14 days,  42 Tablet      **Immunization**    ---      Prevnar 13 : 0.5 mL (Route: Intramuscular) given by Otelia Sergeant on Left  Deltoid    ---      **Procedure Codes**    ---      Prevnar 13    ---      **Follow Up**    ---    3 Months    Electronically signed by Paulla Dolly , MD on 07/22/2019 at 05:31 PM EDT     Sign off status: Completed        * * *        GI Clinic    [address] [place], [address] Floor    [place], [place] [address]    Tel: [phone]    Fax: [phone]              * * *          Progress Note: Lucille Passy, MD 07/22/2019    ---    Note generated by eClinicalWorks EMR/PM Software ([email or web address])

## 2019-07-22 NOTE — Progress Notes (Signed)
* * *      [name], [name] **DOB:** [birth year] (23 yo F) **Acc No.** [id number] **DOS:**  07/22/2019    ---       [name] [name], [name] [name]**    ------    35 Y old Female, DOB: 1996/05/20    [address] [place], [place], [place] [address]    Home: [phone]    Provider: Lucille Passy        * * *    Telephone Encounter    ---    Answered by  Lucille Passy Date: 07/22/2019       Time: 01:56 PM    Reason  Rifaximin    ------            Message                     Boneta Lucks, Is the rifaximin covered for IBS-D in this patient?                Action Taken                     Lucille Passy 07/22/2019 1:56:37 PM >      [name],[name]  07/22/2019 2:11:07 PM > Hi Dr. Revonda Standard, YeS!! It is covered! We have an automatic voucher at [employer] which also lowers the copay from $45 to $0.      Luke Falero V 07/22/2019 4:14:44 PM > hi Liliauna, it was nice seeing you in the clinic today.  If you get the rifaximin here at [facility] we have an automatic voucher system so that that would lower the co-pay to $0.  If you decide to get it at your local pharmacy you may end up paying the full $45.  Please tell me which pharmacy you would like me to send the medication to by calling [phone].                    * * *         **eMessages**  From:  Paulla Dolly    ------    Created:  2019-07-22 16:33:59    Sent:  2019-07-22 16:34:01    Subject:  SW:[id number]    Message:                     Lucille Passy 07/22/2019 1:56:37 PM >      [name],[name]  07/22/2019 2:11:07 PM > Hi Dr. Revonda Standard, YeS!! It is covered! We have an automatic voucher at [employer] which also lowers the copay from $45 to $0.      [name] 07/22/2019 4:14:44 PM > hi Malayna, it was nice seeing you in the clinic today.  If you get the rifaximin here at [facility] we have an automatic voucher system so that that would lower the co-pay to $0.  If you decide to get it at your local pharmacy you may end up paying the full $45.  Please tell me which pharmacy you would like  me to send the medication to by calling [phone].                        ---          * * *         Provider: Lucille Passy 07/22/2019    ---    Note generated by eClinicalWorks EMR/PM Software ([email or web address])

## 2019-07-23 ENCOUNTER — Ambulatory Visit: Admitting: Gastroenterology

## 2019-07-23 LAB — HX IMMUNOLOGY
HX TISSUE TRANSGLUTAMINASE IGA: 0.4 {ELISA'U}
HX TISSUE TRANSGLUTAMINASE IGG: <0.6 {ELISA'U}
HX TTG IGA INTERPRETATION: NEGATIVE
HX TTG IGG INTERPRETATION: NEGATIVE

## 2019-07-23 MED ORDER — Rifaximin: 550 | Tablet | Freq: Three times a day (TID) | 0 refills | 0 days | Status: AC

## 2019-07-23 MED FILL — XIFAXAN 550 MG TAB: 14 days supply | Qty: 42 | Fill #0 | Status: AC

## 2019-07-23 NOTE — Progress Notes (Signed)
* * *      [name], [name] **DOB:** [birth year] (23 yo F) **Acc No.** [id number] **DOS:**  07/23/2019    ---       [name], [name]**    ------    [name], DOB: [birth year]    [address] [place], [place] [address]    Home: [phone]    Provider: Lucille Passy        * * *    Telephone Encounter    ---    Answered by  Keane Police Date: 07/23/2019       Time: 08:16 AM    Reason  rx request    ------            Message                     Hi Dr. Revonda Standard, patient called back about her rifaximin and wanted it sent to the [place] pharmacy                Action Taken                     Chau,Alan  07/23/2019 8:16:47 AM >      Murphy,Heather , RN 07/23/2019 9:35:19 AM > Boneta Lucks, can you assist with this??      [name],[name]  07/23/2019 12:20:58 PM > Hi Heather, Rx sent to [facility] , RN 07/23/2019 2:47:32 PM > This is just an [place], I had transferred from your encounters to [place] so it's all set. Maud Deed 07/23/2019 2:50:24 PM >                 Refills Refill Rifaximin Tablet, 550 MG, Orally, 42 Tablet, 1 tablet, Three  times a day, 14 days, Refills=0    ------          * * *                ---          * * *         Provider: Lucille Passy 07/23/2019    ---    Note generated by eClinicalWorks EMR/PM Software ([email or web address])

## 2019-10-21 ENCOUNTER — Ambulatory Visit: Admit: 2019-10-21 | Payer: PPO

## 2019-10-21 ENCOUNTER — Ambulatory Visit: Admitting: Gastroenterology

## 2019-10-21 ENCOUNTER — Ambulatory Visit: Admitting: Gerontology

## 2019-10-21 LAB — HX HEM-ROUTINE
HX BASO #: 0 10*3/uL (ref 0.0–0.2)
HX BASO: 1 %
HX EOSIN #: 0 10*3/uL (ref 0.0–0.5)
HX EOSIN: 1 %
HX HCT: 37.1 % (ref 32.0–45.0)
HX HGB: 11.8 g/dL (ref 11.0–15.0)
HX IMMATURE GRANULOCYTE#: 0 10*3/uL (ref 0.0–0.1)
HX IMMATURE GRANULOCYTE: 0 %
HX LYMPH #: 1.2 10*3/uL (ref 1.0–4.0)
HX LYMPH: 17 %
HX MCH: 28.4 pg (ref 26.0–34.0)
HX MCHC: 31.8 g/dL — ABNORMAL LOW (ref 32.0–36.0)
HX MCV: 89.2 fL (ref 80.0–98.0)
HX MONO #: 0.6 10*3/uL (ref 0.2–0.8)
HX MONO: 9 %
HX MPV: 10.3 fL (ref 9.1–11.7)
HX NEUT #: 5 10*3/uL (ref 1.5–7.5)
HX NRBC #: 0 10*3/uL
HX NUCLEATED RBC: 0 %
HX PLT: 240 10*3/uL (ref 150–400)
HX RBC BLOOD COUNT: 4.16 M/uL (ref 3.70–5.00)
HX RDW: 12.8 % (ref 11.5–14.5)
HX SEG NEUT: 72 %
HX WBC: 7 10*3/uL (ref 4.0–11.0)

## 2019-10-21 LAB — HX CHEM-OTHER
HX % IRON SATURATION: 27 % (ref 15–40)
HX ALBUMIN: 4.3 g/dL (ref 3.4–4.8)
HX CALCIUM (CA): 9.2 mg/dL (ref 8.5–10.5)
HX FERRITIN: 61 ng/mL (ref 10–240)
HX IRON: 69 ug/dL (ref 37–170)
HX PROTEIN, TOTAL: 6.8 g/dL (ref 6.0–8.3)
HX TOTAL IRON BINDING CAPACITY: 260 ug/dL (ref 253–463)
HX TRANSFERRIN: 186 mg/dL (ref 181–331)

## 2019-10-21 LAB — HX CHEM-PANELS
HX ANION GAP: 10 (ref 3–14)
HX BLOOD UREA NITROGEN: 9 mg/dL (ref 6–24)
HX CHLORIDE (CL): 109 meq/L (ref 98–110)
HX CO2: 21 meq/L (ref 20–30)
HX CREATININE (CR): 0.82 mg/dL (ref 0.57–1.30)
HX GFR, AFRICAN AMERICAN: 115 mL/min/{1.73_m2}
HX GFR, NON-AFRICAN AMERICAN: 100 mL/min/{1.73_m2}
HX GLUCOSE: 79 mg/dL (ref 70–139)
HX POTASSIUM (K): 4.5 meq/L (ref 3.6–5.1)
HX SODIUM (NA): 140 meq/L (ref 135–145)

## 2019-10-21 LAB — HX CHEM-LFT
HX ALANINE AMINOTRANSFERASE (ALT/SGPT): 14 IU/L (ref 0–54)
HX ALKALINE PHOSPHATASE (ALK): 47 IU/L (ref 40–130)
HX ASPARTATE AMINOTRANFERASE (AST/SGOT): 19 IU/L (ref 10–42)
HX BILIRUBIN, DIRECT: 0.2 mg/dL (ref 0.0–0.5)
HX BILIRUBIN, TOTAL: 0.5 mg/dL (ref 0.2–1.1)

## 2019-10-21 LAB — HX CHEM-METABOLIC
HX FOLATE, SERUM: 12.1 ng/mL (ref >=4.0)
HX THYROID STIMULATING HORMONE (TSH): 0.73 u[IU]/mL (ref 0.35–4.94)

## 2019-10-21 LAB — HX DIABETES: HX GLUCOSE: 79 mg/dL (ref 70–139)

## 2019-10-21 LAB — HX CHEM-VITAMIN: HX VITAMIN B12: 509 pg/mL (ref 211–911)

## 2019-10-21 MED ORDER — GaviLyte-C: 240 | bottle | 0 refills | 0 days | Status: AC

## 2019-10-21 NOTE — Progress Notes (Signed)
* * *      Nilan, Ameia **DOB:** 22-Feb-1996 (23 yo F) **Acc No.** [id number] **DOS:**  10/21/2019    ---       [name], Murrel**    ------    81 Y old Female, DOB: December 28, 1995    [address] [place], [place], [place] [address]    Home: [phone]    Provider: Lucille Passy        * * *    Telephone Encounter    ---    Answered by  Felecia Jan Date: 10/21/2019       Time: 11:56 AM    Reason  checkout list    ------            Message                     1.labs today      2.fu booked      3.dexa booked       4.stool kit given                 Action Taken                     Bryant,Ebony  10/21/2019 11:57:16 AM >                     * * *                ---          * * *         Provider: Lucille Passy 10/21/2019    ---    Note generated by eClinicalWorks EMR/PM Software ([email or web address])

## 2019-10-21 NOTE — Progress Notes (Signed)
* * *      Redmann, Temperence **DOB:** 1996/09/16 (23 yo F) **Acc No.** [id number] **DOS:**  10/21/2019    ---       [name], Morrison**    ------    69 Y old Female, DOB: [birth year]    [address] [place], [place], [place] [address]    Home: [phone]    Provider: Lucille Passy        * * *    Telephone Encounter    ---    Answered by  Kaleen Odea Date: 10/21/2019       Time: 11:33 AM    Reason  pre-pr    ------            Refills Start GaviLyte-C Solution Reconstituted, 240 GM, Orally, 1 bottle, if  unavailable, may dispense Gavilyte-N per MD)SIG:take ,, as directed per  Grand instructions, 1 day, Refills=0    ------          * * *                ---          * * *         Provider: Lucille Passy 10/21/2019    ---    Note generated by eClinicalWorks EMR/PM Software ([email or web address])

## 2019-10-21 NOTE — Progress Notes (Signed)
 .  Progress Notes  .  Patient: [name] [name]  Provider: Larwance [name]  DOB:1996-09-10 Age: 24 Y Sex: Female  Supervising Provider:: [name] Passy, MD  Date: 10/21/2019  .  PCP: [name] [name]    Date: 10/21/2019  .  --------------------------------------------------------------------------------  .  REASON FOR APPOINTMENT  .  1. 54M FU  .  HISTORY OF PRESENT ILLNESS  .  GENERAL:  I had the pleasure of seeing Ms.  [name] in the [facility] today for f/u for diarrhea. Ms.  [name] has a h/o celiac disease (dx 2014 via antibodies and  duodenal biopsy). At her last visit (07/22/2019), she reported  4-5 months of frequent loose stools with urgency and nocturnal  bowel movements. She was also having mild abdominal pain. No  known gluten intake. At this time, she was started on a 14-day  course of rifaximin for IBS-D (SIBO variant). Today, she reports  no improvement in symptoms during and post-rifaximin course. She  reports 6-8 watery bowel movements daily. Reports a few episodes  of black stools, reports mild abdominal cramping prior to bowel  movements. Reports urgency, as well as frequent nocturnal BMs.  Reports increase in frequency of nocturnal BMs since last visit.  Notes occasional abdominal pain following bowel movements. The  pain subsides relatively quickly without intervention. Denies  hematochezia. Notes 3 episodes since last visit (   1 per month)  of nausea, vomiting and severe abdominal pain. She believes she  may have an intolerance to avocadoes, as at least 2 of these  occasions occurred following ingestion of avocado. She has since  been avoiding avocado.Reports appetite unchanged, good. Eats a  strict gluten-free diet, variety of whole, unprocessed foods  including meat, vegetables, and fruits. Denies weight loss,  fever, frequent headaches, eye pain/problems, rash, joint pain.  She does note frequent night sweats over the past month.  .  Ambulatory Falls and Injury  Prevention:  HPI  .  Marland Kitchen  Have you experienced a fall in the past year?No  Is the patient using assistive devices such as a cane or  walker?No  Do you need assistance with ambulation while at our facility?No  Interventionsnone, patient not a fall risk , ensured environment  free from obstacles and obstructions  .  CURRENT MEDICATIONS  .  None  .  PAST MEDICAL HISTORY  .  Celiac disease - adherent to gluten free diet  Depression/anxiety - diagnosed during Freshman year of college,  never on medication  Left shoulder labral tear  Left knee injury - medial and lateral menisus tears, ACL tear  Chondromalacia patellae  Right ankle sprain  .  ALLERGIES  .  N.K.D.A.  .  SURGICAL HISTORY  .  Left ACL tear, lateral and medial meniscus injury repair. Feb  2013  Left shoulder labral repair 05/14  Left medial meniscus repair 06/15  .  FAMILY HISTORY  .  Mother: alive, diagnosed with Hypertension  Father: alive  Maternal Grand Mother: Depression  Maternal Grand Father: deceased, Diabetes mellitus without  mention of complication, type II or unspecified type, not stated  as uncontrolled, Hypertension  Siblings: Sister - depression, anxiety Twin brothers, healthy  No known hx of autoimmune diseases.Denies early CAD, CVA, thyroid  d/o, substance abuseDenies h/o breast, colon, ovarian, skin  cancersDenies f/h of CRC, thyroid disease, or celiac  diseaseSister with gastroparesis.  .  SOCIAL HISTORY  .  .  Tobacco  history:Never smoked  .  Marland Kitchen  Military History  Have you or anyone in your family served in the [employer] in any  capacity?Yes  If YES, describe whosibling  .  Marland Kitchen  Depression Screening  Depression Screening FindingsNegative  .  Marland Kitchen  Sexual history  Never sexually active  .  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  .  .  Exercise: Was competing in [employer], exercising daily.  .  .  Alcohol  Socially 1 glass, once/month  .  Marland Kitchen  Abuse/Neglect  Do you feel unsafe in your relationships?No  PlanPatient states they feel  safe to return home  Have you ever been hit, kicked, punched or otherwise hurt by  someone in the past year? No  .  .  Illicit drugs: Denies.  .  .  Seat Belts: always.  .  .  Lives with: Parents.  .  Feels safe in her homeDenies guns in the homeGymnastics and  rowing while in high schoolDenies tobacco use, occasional ETOH,  or illicits [employer] major in bio and chem.  Marland Kitchen  HOSPITALIZATION/MAJOR DIAGNOSTIC PROCEDURE  .  No Hospitalization History.  Marland Kitchen  REVIEW OF SYSTEMS  .  Constitutional: Reports night sweats x1 month. No recent weight  change, fever, or fatigueGastrointestinal: Reports black stools,  abdominal cramping, pain, and frequent diarrhea up to 6-8 BMs per  day and 1-2 nocturnal BMs with urgency. Has had 3 episodes of  nausea with vomiting and abdominal pain since last visit. No loss  of appetite, constipation, painful bowel movements, rectal  bleeding, or peptic ulcer diseaseCardiovascular: No heart  disease, chest pain, angina, palpitations, shortness of breath,  swelling of feet, ankles, or handsNeurological: No frequent  headaches, lightheaded or dizziness,  numbness/tinglingRespiratory: No chronic cough/phlegm production,  spitting up blood, shortness or breath, asthma or  wheezingEndocrine: Reports night sweats x1 month. No glandular or  hormone problems, thyroid disease, diabetes, heat/cold  intolerance, frequent urination/excessive  thirstHematologic/lymphatic: Not slow to heal after cuts. No  bleeding or bruising easily, anemia, phlebitis, enlargement of  glands, or past blood transfusionIntegumentary: No rash or  itching, change in color of skin, change in hair or  nailsGenitourinary: No frequent urination, burning or painful  urination, blood in urine, incontinenceMusculoskeletal: No joint  pain, muscle aches, or joint swelling.  Marland Kitchen  VITAL SIGNS  .  Ht-in 66.34, Wt-lbs 163.4, BMI 26.10, BP 120/73, HR 84, BSA 1.86,  O2 99%RA, Wt-kg 74.12.  Marland Kitchen  PHYSICAL EXAMINATION  .  Constitutional:  Well-developed; alert, oriented; no acute  distressSkin: No rashes; no telangiectasiasHEENT: Conjunctiva  noninjected; no scleral icterus; no oral lesions; mucosa moist;  teeth in good repair; neck supple; no cervical  lymphadenopathy.Integumentary: No rashes, jaundice, palmar  erythema.Cardiovascular: Regular rate and rhythm; S1 and S2  auscultated; no peripheral edemaRespiratory: Clear to  auscultation throughout; no respiratory distressGastrointestinal:  Abdomen soft, non-tender, non-distended; no palpable masses; no  obvious herniasRectal exam: DeferNeurologic: Appropriate; normal  gait; no focal deficitsUpper Musculoskeletal: No atrophy; no  edema; no clubbingLower Musculoskeletal: No atrophy; no  edemaLymphatics/hematologic/immunologic: No cervical  lymphadenopathyPsychiatric: Alert and oriented X 3; mood  appropriate; good eye contact; memory intact.  .  ASSESSMENTS  .  Celiac disease - K90.0 (Primary), Celiac disease puts patients at  greater risk for pneumococcal illness. She received the  Prevnar-13 at her last visit. She is agreeable to receive the  Pneumovax-23 today. As part of the health maintenance of those  with celiac disease, will also have her iron studies and liver  tests drawn today, as well have scheduling of DEXA scan.  .  Night sweats - R61, Labs today: CMP, CBC, TSH with reflex, stool  studies (provide specimen kit for home).  .  Chronic diarrhea - K52.9  .  Black stools - K92.1, Ms. Edgin reports a few instances of  black liquid stool over the past 3 months. Will have her go for  upper endoscopy.  .  Nocturnal diarrhea - R19.7  .  In summary, Ms. Karow is a 24 y/o female with PMH of celiac  disease (dx 2014) who has been having several loose-to-watery  stools per day including some overnight for the past 1 year. Her  nocturnal symptoms appear to be increasing in frequency. Her  celiac disease puts her at increased risk for microscopic colitis  and IBS-D SIBO variant. A course of  rifaximin for IBS-D SIBO  variant did not improve her symptoms. Recent celiac serologies  did not indicate she was consuming gluten. DDx includes  infectious, microscopic colitis, inflammatory bowel disease,  hyperthyroidism. Will obtain iron studies and TSH with reflex  today, and provide stool specimen kit for stool studies. I will  also arrange for her to undergo a colonoscopy. If unrevealing,  will consider referral to for nutritional counseling vs referral  to the [facility] for further testing.  .  TREATMENT  .  Celiac disease  LAB: Alkaline Phosphatase (ALK)  .  LAB: Bilirubin, Direct (DBIL)  .  LAB: Bilirubin, Total (TBIL)  .  LAB: Iron (IRON)  .  LAB: Aspartate aminotransferase (AST)  .  LAB: Alanine aminotransferase (ALT)  .  LAB: Ferritin (FER)  .  LAB: Vitamin B12 (B12)  .  LAB: Folate, Serum (FOLAT)  .  LAB: Comp. Metabolic Panel (CMP; [id number])  .  LAB: Iron/TIBC (IRONP)  .  LAB: CBC/DIFF with PLT (CBCWD)  .  LAB: TSH Reflex (TSHR; Result-based TT3 and/or FT4)  .  Notes:  .  1) Prevnar 13 recdived in October 2020. Will receive Pneumovax 23  today.  2) Please schedue DEXA scan.  3) Labs today.  .  .  .  .  Night sweats  Notes: 1) Labs today. Stool studies.  .  .  Chronic diarrhea  Notes: 1) Please provide patient with stool specimen kit.  2) Stool studies.  .  .  Black stools  ESOPHAGOGASTRODUODENOSCOPY  Notes: 1) EGD.  2) Stool studies.  .  .  Nocturnal diarrhea  LAB: Stool C Diff toxin (CDIF)  .  LAB: Giardia and Cryptosporidium Antigen Panel  .  LAB: Culture Stool, Molecular Screen  .  LAB: Calprotectin, Stool (CALPR)  .  COLONOSCOPY AND BIOPSY  Notes: 1) Stool studies today (needs stool specimen kit) 2) labs  today. 3) Start Imodium 1-2 tablets before bedtime. 4)  Colonoscopy.  Marland Kitchen  PROCEDURE CODES  .  84696 Pneumovax (PPSV23)  .  FOLLOW UP  .  4 Weeks  .  Marland Kitchen  Appointment Provider: Larwance Sachs, NP  .  Electronically signed by Paulla Dolly , MD on  10/22/2019 at 05:13 PM  EST  .  CONFIRMATORY SIGN OFF  ULERY,SUSAN 10/22/2019 11:20:58 AM > ALLISON,HARMONY V 10/22/2019 5:12:40 PM > , I reviewed the history with the patient. I verified the findings on the clinical examination as documented above. I discussed the patient with [name] Sachs, NP and I agree with the assessment and plan as outlined above.  .  Document electronically signed by [name] [name]    .

## 2019-10-21 NOTE — Progress Notes (Signed)
 * * *    [name], [name] **DOB:** October 25, 1995 (23 yo F) **Acc No.** [id number] **DOS:**  10/21/2019    ---       [name] [name], [name] [name]**    ------    68 Y old Female, DOB: [birth year], External MRN: [id number]    Account Number: [id number]    [address] [place], [id number]    Home: [phone]    Insurance: [employer]unning PPO    PCP: Jonathon Bellows Referring: Jonathon Bellows    Appointment Facility[facility]        * * *    10/21/2019  **Appointment Provider:** Larwance Sachs, NP **CHN#:** [id number]    ------     **Supervising Provider:** Lucille Passy, MD    ---       **Reason for Appointment**    ---      1\. 2M FU    ---      **History of Present Illness**    ---     _GENERAL_ :    I had the pleasure of seeing [name] [name] in the [facility] today for f/u  for diarrhea. [name] [name] has a h/o celiac disease (dx 2014 via antibodies  and duodenal biopsy). At her last visit (07/22/2019), she reported 4-5 months  of frequent loose stools with urgency and nocturnal bowel movements. She was  also having mild abdominal pain. No known gluten intake. 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    Today, she reports no improvement in symptoms during and post-rifaximin  course. She reports 6-8 watery bowel movements daily. Reports a few episodes  of black stools, reports mild abdominal cramping prior to bowel movements.  Reports urgency, as well as frequent nocturnal BMs. Reports increase in  frequency of nocturnal BMs since last visit. Notes occasional abdominal pain  following bowel movements. The pain subsides relatively quickly without  intervention. Denies hematochezia.    Notes 3 episodes since last visit (~ 1 per month) of nausea, vomiting and  severe abdominal pain. She believes she may have an intolerance to avocadoes,  as at least 2 of these occasions occurred following ingestion of avocado. She  has since been avoiding avocado.    Reports appetite unchanged,  good. Eats a strict gluten-free diet, variety of  whole, unprocessed foods including meat, vegetables, and fruits.    Denies weight loss, fever, frequent headaches, eye pain/problems, rash, joint  pain. She does note frequent night sweats over the past month.     _Ambulatory Falls and Injury Prevention_ :    HPI    Have you experienced a fall in the past year? _No_    Is the patient using assistive devices such as a cane or walker? _No_    Do you need assistance with ambulation while at our facility? _No_    Interventions _none, patient not a fall risk , ensured environment free from  obstacles and obstructions_      **Current Medications**    ---      None    ---      **Past Medical History**    ---      Celiac disease - adherent to gluten free diet.        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        ---    Left shoulder labral tear.        ---  Left knee injury - medial and lateral menisus tears, ACL tear.        ---    Chondromalacia patellae.        ---    Right ankle sprain.        ---      **Surgical History**    ---      Left ACL tear, lateral and medial meniscus injury repair. Feb 2013    ---    Left shoulder labral repair 05/14    ---    Left medial meniscus repair 06/15    ---      **Family History**    ---      Mother: alive, diagnosed with Hypertension    ---    Father: alive    ---    Maternal Grand Mother: Depression    ---    Maternal Grand Father: deceased, Diabetes mellitus without mention of  complication, type II or unspecified type, not stated as uncontrolled,  Hypertension    ---    Siblings: Sister - depression, anxiety Twin brothers, healthy    ---    No known hx of autoimmune diseases.    Denies early CAD, CVA, thyroid d/o, substance abuse    Denies h/o breast, colon, ovarian, skin cancers    Denies f/h of CRC, thyroid disease, or celiac disease    Sister with gastroparesis.    ---      **Social History**    ---    Tobacco     history: _Never smoked_    Military History    Have you or anyone in your family served in the [employer] in any capacity?  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    Exercise: Was competing in [employer], exercising daily.    Alcohol    _Socially 1 glass, once/month_    Abuse/Neglect    Do you feel unsafe in your relationships? _No_    Plan _Patient states they feel safe to return home_    Have you ever been hit, kicked, punched or otherwise hurt by someone in the  past year? _No_    Illicit drugs: Denies.    Seat Belts: always.    Lives with: Parents.  Feels safe in her home    Denies guns in the home    Gymnastics and rowing while in high school    Denies tobacco use, occasional ETOH, or illicits    In [employer] major in bio and chem.    ---      **Allergies**    ---      N.K.D.A.    ---      **Hospitalization/Major Diagnostic Procedure**    ---      No Hospitalization History.    ---      **Review of Systems**    ---    Constitutional: Reports night sweats x1 month. No recent weight change, fever,  or fatigue    Gastrointestinal: Reports black stools, abdominal cramping, pain, and frequent  diarrhea up to 6-8 BMs per day and 1-2 nocturnal BMs with urgency. Has had 3  episodes of nausea with vomiting and abdominal pain since last visit. No loss  of appetite, constipation, painful bowel movements, rectal bleeding, or peptic  ulcer disease    Cardiovascular: No  heart disease, chest pain, angina, palpitations, shortness  of breath, swelling of feet, ankles, or hands    Neurological: No frequent headaches, lightheaded or dizziness,  numbness/tingling    Respiratory: No chronic cough/phlegm production, spitting up blood, shortness  or breath, asthma or wheezing    Endocrine: Reports night sweats x1 month. No glandular or hormone  problems,  thyroid disease, diabetes, heat/cold intolerance, frequent urination/excessive  thirst    Hematologic/lymphatic: Not slow to heal after cuts. No bleeding or bruising  easily, anemia, phlebitis, enlargement of glands, or past blood transfusion    Integumentary: No rash or itching, change in color of skin, change in hair or  nails    Genitourinary: No frequent urination, burning or painful urination, blood in  urine, incontinence    Musculoskeletal: No joint pain, muscle aches, or joint swelling.      **Vital Signs**    ---    Ht-in 66.34, Wt-lbs 163.4, BMI 26.10, BP 120/73, HR 84, BSA 1.86, O2 99%RA,  Wt-kg 74.12.      **Physical Examination**    ---    Constitutional: Well-developed; alert, oriented; no acute distress    Skin: No rashes; no telangiectasias    HEENT: Conjunctiva noninjected; no scleral icterus; no oral lesions; mucosa  moist; teeth in good repair; neck supple; no cervical lymphadenopathy.    Integumentary: No rashes, jaundice, palmar erythema.    Cardiovascular: Regular rate and rhythm; S1 and S2 auscultated; no peripheral  edema    Respiratory: Clear to auscultation throughout; no respiratory distress    Gastrointestinal: Abdomen soft, non-tender, non-distended; no palpable masses;  no obvious hernias    Rectal exam: Defer    Neurologic: Appropriate; normal gait; no focal deficits    Upper Musculoskeletal: No atrophy; no edema; no clubbing    Lower Musculoskeletal: No atrophy; no edema    Lymphatics/hematologic/immunologic: No cervical lymphadenopathy    Psychiatric: Alert and oriented X 3; mood appropriate; good eye contact;  memory intact.      **Assessments**    ---    1\. Celiac disease - K90.0 (Primary), Celiac disease puts patients at greater  risk for pneumococcal illness. She received the Prevnar-13 at her last visit.  She is agreeable to receive the Pneumovax-23 today. As part of the health  maintenance of those with celiac disease, will also have her iron studies and  liver  tests drawn today, as well have scheduling of DEXA scan.    ---    2\. Night sweats - R61, Labs today: CMP, CBC, TSH with reflex, stool studies  (provide specimen kit for home).    ---    3\. Chronic diarrhea - K52.9    ---    4\. Black stools - K92.1, [name] [name] reports a few instances of black  liquid stool over the past 3 months. Will have her go for upper endoscopy.    ---    5\. Nocturnal diarrhea - R19.7    ---     In summary, [name] [name] is a 24 y/o female with PMH of celiac disease (dx  2014) who has been having several loose-to-watery stools per day including  some overnight for the past 1 year. Her nocturnal symptoms appear to be  increasing in frequency. Her celiac disease puts her at increased risk for  microscopic colitis and IBS-D SIBO variant. A course of rifaximin for IBS-D  SIBO variant did not improve her symptoms. Recent celiac serologies did not  indicate she was consuming gluten. DDx includes infectious,  microscopic  colitis, inflammatory bowel disease, hyperthyroidism. Will obtain iron studies  and TSH with reflex today, and provide stool specimen kit for stool studies. I  will also arrange for her to undergo a colonoscopy. If unrevealing, will  consider referral to for nutritional counseling vs referral to the [facility] for further testing.    ---      **Treatment**    ---      **1\. Celiac disease**    _LAB: Alkaline Phosphatase (ALK)_    _LAB: Bilirubin, Direct (DBIL)_    _LAB: Bilirubin, Total (TBIL)_    _LAB: Iron (IRON)_    _LAB: Aspartate aminotransferase (AST)_    _LAB: Alanine aminotransferase (ALT)_    _LAB: Ferritin (FER)_    _LAB: Vitamin B12 (B12)_    _LAB: Folate, Serum (FOLAT)_    _LAB: Comp. Metabolic Panel (CMP; 80053)_    _LAB: Iron/TIBC (IRONP)_    _LAB: CBC/DIFF with PLT (CBCWD)_    _LAB: TSH Reflex (TSHR; Result-based TT3 and/or FT4)_    Notes:    1) Prevnar 13 recdived in October 2020. Will receive Pneumovax 23 today.    2) Please schedue DEXA  scan.    3) Labs today.    .    ---        **2\. Night sweats**    Notes: 1) Labs today. Stool studies.        **3\. Chronic diarrhea**    Notes: 1) Please provide patient with stool specimen kit.    2) Stool studies.        **4\. Black stools**    _PROCEDURE: ESOPHAGOGASTRODUODENOSCOPY_    Notes: 1) EGD.    2) Stool studies.        **5\. Nocturnal diarrhea**    _LAB: Stool C Diff toxin (CDIF)_    _LAB: Giardia and Cryptosporidium Antigen Panel_    _LAB: Culture Stool, Molecular Screen_    _LAB: Calprotectin, Stool (CALPR)_    _PROCEDURE: COLONOSCOPY AND BIOPSY_    Notes: 1) Stool studies today (needs stool specimen kit) 2) labs today. 3)  Start Imodium 1-2 tablets before bedtime. 4) Colonoscopy.     **Immunization**    ---      Pneumovax (PPSV23) : 0.5 mL (Route: Intramuscular) given by Larwance Sachs on  Left Deltoid    ---      **Procedure Codes**    ---      54098 Pneumovax (PPSV23)    ---      **Follow Up**    ---    4 Weeks    **Appointment Provider:** Larwance Sachs, NP    Electronically signed by Paulla Dolly , MD on 10/22/2019 at 05:13 PM EST    Sign off status: Completed        * * *        GI Clinic    [address] [place], [address]    [place], [place] [address]    Tel: [phone]    Fax: [phone]              * * *          Progress Note: Larwance Sachs, NP 10/21/2019    ---    Note generated by eClinicalWorks EMR/PM Software ([email or web address])

## 2019-10-24 ENCOUNTER — Ambulatory Visit: Admit: 2019-10-24 | Payer: PPO

## 2019-10-24 ENCOUNTER — Ambulatory Visit: Admitting: Gastroenterology

## 2019-10-25 LAB — HX MICRO
HX CRYPTOSPORIDUM ANTIGEN: NOT DETECTED
HX GIARDIA ANTIGEN: NOT DETECTED

## 2019-10-27 ENCOUNTER — Ambulatory Visit: Admitting: Gastroenterology

## 2019-10-27 NOTE — Progress Notes (Signed)
* * *      [name] [name], [name] [name] **DOB:** Jul 22, 1996 (23 yo F) **Acc No.** [id number] **DOS:**  10/27/2019    ---       [name] [name], [name] [name]**    ------    45 Y old Female, DOB: 1996/02/04    [address] [place], [place], [place] [address]    Home: [phone]    Provider: Lucille Passy        * * *    Telephone Encounter    ---    Answered by  Raleigh Nation Date: 10/27/2019       Time: 02:05 PM    Reason  appt confirm    ------            Message                     left voicemail to confirm appt                Action Taken                     Marsh,Marilyn , RN 10/27/2019 2:06:04 PM >                     * * *                ---          * * *         Provider: Lucille Passy 10/27/2019    ---    Note generated by eClinicalWorks EMR/PM Software ([email or web address])

## 2019-10-28 LAB — HX OSTEOPOROSIS

## 2019-10-29 ENCOUNTER — Ambulatory Visit

## 2019-11-01 ENCOUNTER — Ambulatory Visit: Admitting: Gastroenterology

## 2019-11-01 LAB — HX MICRO: HX CALPROTECTIN, STOOL: 47 ug/g

## 2019-11-01 NOTE — Progress Notes (Signed)
* * *      [name] [name], [name] [name] **DOB:** 1996/03/21 (23 yo F) **Acc No.** [id number] **DOS:**  11/01/2019    ---       [name] [name], [name] [name]**    ------    46 Y old Female, DOB: May 21, 1996    [address] [place], [place], [place] [address]    Home: [phone]    Provider: Sherian Maroon        * * *    Telephone Encounter    ---    Answered by  Sherian Maroon Date: 11/01/2019       Time: 04:47 PM    Caller  patient    ------            Reason  bowel prep            Message                     Patient called as pharmacy did not have bowel prep.  Discussed different options, she opted for Suprep.  Rx sent to pharmacy.                     * * *                ---          * * *         Provider: Sherian Maroon 11/01/2019    ---    Note generated by eClinicalWorks EMR/PM Software ([email or web address])

## 2019-11-03 ENCOUNTER — Ambulatory Visit: Admit: 2019-11-03 | Payer: PPO

## 2019-11-03 ENCOUNTER — Ambulatory Visit: Admitting: Gastroenterology

## 2019-11-07 LAB — HX SURGICAL

## 2019-11-07 LAB — HX COLONOSCOPY

## 2019-11-18 ENCOUNTER — Ambulatory Visit: Admit: 2019-11-18 | Payer: PPO

## 2019-11-18 ENCOUNTER — Ambulatory Visit: Admitting: Gerontology

## 2019-11-18 ENCOUNTER — Ambulatory Visit: Admitting: Gastroenterology

## 2019-11-18 MED ORDER — Colestipol HCl: 1 | 60 | Freq: Every day | 1 refills | 0 days | Status: AC

## 2019-11-18 MED ORDER — Budesonide: 3 | Capsule | Freq: Every day | 2 refills | 0 days | Status: AC

## 2019-11-18 NOTE — Progress Notes (Signed)
 * * *    [name], [name] **DOB:** [birth year] (23 yo F) **Acc No.** [id number] **DOS:**  11/18/2019    ---       [name] [name], [name] [name]**    ------    [name], DOB: 1996-02-16    [address] [place], [place], [place] [address]    Home: [phone]    Provider: Larwance Sachs        * * *    Telephone Encounter    ---    Answered by  Larwance Sachs Date: 11/18/2019       Time: 02:17 PM    Reason  2/23 clinic f/u    ------            Message                     - taper Budesonide after 8 weeks from today      - Colestipol for night time symptoms now      - EGD showed evidence of uncontrolled celiac disease - refer to Consuella Lose (Dietician)      - 01/27/20 f/u with Dr. Revonda Standard                Action Taken                     Vasilis Luhman  11/18/2019 2:17:08 PM >      [name]  11/20/2019 9:23:15 AM > portal message to pt: "Good morning, [name] [name],             I had a chance to discuss your EGD findings with Dr. 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 The EGD (upper endoscopy) findings of evidence of uncontrolled celiac disease suggest that you are consuming gluten. This may be in very small amounts, hidden in seasonings, dietary supplements, processed foods produced in facilities produce items with gluten in them. I would like for you to see our dietician, Consuella Lose, for assistance in finding these hidden sources of gluten. I will ask our adminstrative staff to help with this referral. Please let me know if you have any questions. Warmly, Larwance Sachs, NP"                          * * *        **eMessages**  From:  Larwance Sachs    ------    Created:  2019-11-20 09:23:10    Sent:  2019-11-20 09:23:17    Subject:  RE:2/23 clinic f/u    Message:  Good morning, [name] [name],        I had a chance to discuss your EGD findings with Dr. 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 The EGD (upper  endoscopy) findings of evidence of  uncontrolled celiac disease suggest that  you are consuming gluten. This may be in very small amounts, hidden in  seasonings, dietary supplements, processed foods produced in facilities  produce items with gluten in them. I would like for you to see our dietician,  Consuella Lose, for assistance in finding these hidden sources of gluten. I  will ask our adminstrative staff to help with this referral.        Please let me know if you have any questions.        Warmly,        Larwance Sachs, NP                    ---          * * *  Provider: Larwance Sachs 11/18/2019    ---    Note generated by eClinicalWorks EMR/PM Software ([email or web address])

## 2019-11-18 NOTE — Progress Notes (Signed)
 Gambino, Gissele **DOB:** 1995-11-23 (24 yo F) **Acc No.** [id number] **DOS:**  11/18/2019    ---      **Progress Notes**    ---    **Patient:** MAROLYN, URSCHEL     **Account Number:** [id number] **External MRN:** [id number]  **Appointment  Provider:** Larwance Sachs, NP     **DOB:** 07-01-1996 **Age:** 24 Y **Sex:** Female  **Date:** 11/18/2019     **Phone:** [phone]  **CHN#:** [id number]     **Address:** [address] [place], [address]     **Pcp:** Pcp [id number]        * * *         **Subjective:**        ---      **Chief Complaints:**    ------        ------     **HPI:**    _Ambulatory Falls and Injury Prevention_ :    HPI    Have you experienced a fall in the past year? _No_    Is the patient using assistive devices such as a cane or walker? _No_    Do you need assistance with ambulation while at our facility? _No_    Interventions _none, patient not a fall risk_    _GENERAL_ :    Ms. Houchin is a 24 y/o female with PMH significant celiac disease who  presents to the [facility] today for f/u following endoscopic procedure.    - Labs 10/21/2019 CMP, CBC, fecal calprotectin, stool studies unremarkable    - EGD 11/03/2019 - endoscopically normal; duodenum with increased  intraepithelial lymphocytes and patchy mild villous architectural changes on  histology.    - Colonoscopy 11/03/2019 - endoscopically normal; ileal mucosa with increase  intraepithelial lymphocytes on histology; colon with lymphocytic colitis on  histology    - DEXA 10/24/2019 - BMD within expected limits for age.    Received pneumonia vaccines.    Today, Ms. Schirm reports that she continues to have 6-7 BMs per day, all  loose; no blood and no black stools. She does have some hurry, urgency, and  nocturnal bowel movements.    She reports occasional N/V, infrequent ABD pain. Reports her weight is down  3.4 lbs since last visit. Appetite good. Denies NSAIDs, PPIs, smoking.    ------      **ROS:**    Constitutional: Reports mild weight  loss. Denies fever, or fatigue.    Gastrointestinal: See HPI.    Cardiovascular: No heart disease, chest pain, angina, palpitations, shortness  of breath, swelling of feet, ankles, or hands.    Neurological: No frequent headaches, lightheaded or dizziness,  numbness/tingling.    Respiratory: No chronic cough/phlegm production, spitting up blood, shortness  or breath, asthma or wheezing.    Endocrine: No glandular or hormone problems, thyroid disease, diabetes,  heat/cold intolerance, frequent urination/excessive thirst.    Hematologic/lymphatic: Not slow to heal after cuts. No bleeding or bruising  easily, anemia, phlebitis, enlargement of glands, or past blood transfusion.    Integumentary: No rash or itching, change in color of skin, change in hair or  nails.    Genitourinary: No frequent urination, burning or painful urination, blood in  urine, incontinence.    Musculoskeletal: No joint pain, muscle aches, or joint swelling.    ------      **Medical History:** Celiac disease - adherent to gluten free diet,  Depression/anxiety - diagnosed during Freshman year of college, never on  medication, Left shoulder labral tear, Left knee injury - medial and lateral  menisus tears, ACL tear, Chondromalacia patellae, Right ankle sprain,  Lymphocytic colitis.        ------     **Surgical History:** Left ACL tear, lateral and medial meniscus injury  repair. Feb 2013, Left shoulder labral repair 05/14, Left medial meniscus  repair 06/15, EGD 10/2019, Colonoscopy 10/2019.    ------     **Family History:** Mother: alive, diagnosed with Hypertension. Father:  alive. Maternal Grand Mother: Depression. Maternal Grand Father: deceased,  Diabetes mellitus without mention of complication, type II or unspecified  type, not stated as uncontrolled, Hypertension. Siblings: Sister - depression,  anxiety Twin brothers, healthy.    Denies early CAD, CVA, thyroid d/o, substance abuse    Denies h/o breast, colon, ovarian, skin cancers     Denies f/h of CRC, thyroid disease, or celiac disease    Sister with gastroparesis.    ------      **Social History:**    Tobacco    history: _Never smoked_    Hotel manager History    Have you or anyone in your family served in the [employer] in any capacity?  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    Exercise: Was competing in [employer], exercising daily.    Alcohol    _Socially 1 glass, once/month_    Abuse/Neglect    Do you feel unsafe in your relationships? _No_    Have you ever been hit, kicked, punched or otherwise hurt by someone in the  past year? _No_    Plan _Patient states they feel safe to return home_    Illicit drugs: Denies.    Seat Belts: always.    Lives with: Parents.    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 Lives at home with parents.    ------      **Medications:** None    ------      **Allergies:** N.K.D.A.    ------         **Objective:**        ---      **Vitals:** Pain scale 0, Ht-in 66.34, Wt-lbs 160, BMI 25.56, BP 119/71,  BSA 1.84, O2 100%RA, Ht-cm 168.5, Wt-kg 72.58, Wt Change -3.4 lb    11/17/19 - weight down 3.4 lbs since last visit.    ------      **Physical Examination:**    Constitutional: Well-developed; alert, oriented; no acute distress.    Skin: No rashes; no telangiectasias.    HEENT: Conjunctiva noninjected; no scleral icterus; no oral lesions; mucosa  moist; teeth in good repair; neck supple; no cervical lymphadenopathy.    Integumentary: No rashes, jaundice, palmar erythema.    Cardiovascular: Regular rate and rhythm; S1 and S2 auscultated; no peripheral  edema.    Respiratory: Clear to auscultation throughout; no respiratory distress.     Gastrointestinal: Abdomen soft, non-tender, non-distended; no palpable masses;  no obvious hernias.    Rectal exam: Defer.    Neurologic: Appropriate; normal gait; no focal deficits.    Upper Musculoskeletal: No atrophy; no edema; no clubbing.    Lower Musculoskeletal: No atrophy; no edema.    Lymphatics/hematologic/immunologic: No  cervical lymphadenopathy.    Psychiatric: Well-groomed, appropriately dressed. Alert and oriented X 3; mood  appropriate; good eye contact; memory intact.    ------         **Assessment:**        ---      **Assessment:**        1\. 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 Lampron continues to have  6-7 loose to watery bowel movements per day. She does have hurry, and urgency  and 1-2 nocturnal stools per night despite use of Imodium. Denies N/V, ABD  pain, hematochezia, melena. Reports weight loss (3.4 lbs since last visit per  EMR). Colonoscopy 11/03/2019 revealed lymphocytic colitis. Patient denies  NSAIDs, PPIs, smoking. Will treat with Budesonide 9 mg daily for 8 weeks.  Then, plan to taper to 6 mg for 2 weeks, then 3 mg for 2 weeks, then  discontinue therapy. If not in clinical remission at 8 weeks, or if symptoms  recur on tapering, will plan to continue budesonide at 9 mg daily for 12 weeks  or longer before tapering. Patient may use Colestipol before bedtime for  nocturnal symptoms until improved on Budesonide.    2\. Celiac disease - K90.0, Ms. Nakajima reportedly continues on strict  gluten-free diet. Discussed EGD findings, and that these findings may indicate  unintentional gluten intake. Will have her look for hidden sources of gluten  in her diet. I will also refer her to Consuella Lose, (dietician) for  assistance with this. Health maintenance: Prevnar-13, Pneumovax-23 received  DEXA 2021 - BMD WNL    ------        **Plan:**        ---       **1\. Lymphocytic colitis**    Start Budesonide Capsule Delayed Release Particles, 3 MG, 3 capsules, Orally,  Once a day,  30 day(s), 90 Capsule, Refills 2 ; Start Colestipol HCl Tablet, 1  GM, 2 tablets, Orally, Once a day, 30 day(s), 60, Refills 1 .    Notes: 1) Start Budesonide today for 8 week course, then taper. 2) Take 2 gm  of Colestipole before bedtime. 3) If the Colestipol does not improve nocturnal  symptoms, ou try Imodium 4 mg dose prior to bedtime.    ---     **Follow Up:** 2 Months    ------    ---    ---                ---    Electronically signed by Larwance Sachs on 11/20/2019 at 05:12 PM EST    Sign off status: Completed          * * *      **Appointment Provider:** Larwance Sachs, NP  **Date:** 11/18/2019    ------

## 2019-11-18 NOTE — Progress Notes (Signed)
 .  Progress Notes  .  Patient: [name] [name]  Provider: ULERY, SUSAN    .  DOB:[birth year] Age: 24 Y Sex: Female  .  PCP: Jonathon Bellows    Date: 11/18/2019  .  --------------------------------------------------------------------------------  .  HISTORY OF PRESENT ILLNESS  .  Ambulatory Falls and Injury Prevention:  HPI  .  Marland Kitchen  Have you experienced a fall in the past year?No  Is the patient using assistive devices such as a cane or  walker?No  Do you need assistance with ambulation while at our facility?No  Interventionsnone, patient not a fall risk  .  GENERAL:  Ms. [name] [name] is a 24 y/o female with  PMH significant celiac disease who presents to the [facility]  today for f/u following endoscopic procedure. - Labs 10/21/2019  CMP, CBC, fecal calprotectin, stool studies unremarkable - EGD  11/03/2019 - endoscopically normal; duodenum with increased  intraepithelial lymphocytes and patchy mild villous architectural  changes on histology. - Colonoscopy 11/03/2019 - endoscopically  normal; ileal mucosa with increase intraepithelial lymphocytes on  histology; colon with lymphocytic colitis on histology- DEXA  10/24/2019 - BMD within expected limits for age.Received pneumonia  vaccines. Today, [name] reports that she continues to  have 6-7 BMs per day, all loose; no blood and no black stools.  She does have some hurry, urgency, and nocturnal bowel movements.  She reports occasional N/V, infrequent ABD pain. Reports her  weight is down 3.4 lbs since last visit. Appetite good. Denies  NSAIDs, PPIs, smoking.  .  CURRENT MEDICATIONS  .  None  .  PAST MEDICAL HISTORY  .  Celiac disease - adherent to gluten free diet, Depression/anxiety  - diagnosed during Freshman year of college, never on medication,  Left shoulder labral tear, Left knee injury - medial and lateral  menisus tears, ACL tear, Chondromalacia patellae, Right ankle  sprain, Lymphocytic colitis.  .  ALLERGIES  .  N.K.D.A.  .  SURGICAL HISTORY  .  Left  ACL tear, lateral and medial meniscus injury repair. Feb  2013, Left shoulder labral repair 05/14, Left medial meniscus  repair 06/15, EGD 10/2019, Colonoscopy 10/2019.  Marland Kitchen  FAMILY HISTORY  .  Mother: alive, diagnosed with Hypertension  Father: alive  Maternal Grand Mother: Depression  Maternal Grand Father: deceased, Diabetes mellitus without  mention of complication, type II or unspecified type, not stated  as uncontrolled, Hypertension  Siblings: Sister - depression, anxiety Twin brothers, healthy  Denies early CAD, CVA, thyroid d/o, substance abuseDenies h/o  breast, colon, ovarian, skin cancersDenies f/h of CRC, thyroid  disease, or celiac diseaseSister with gastroparesis.  .  SOCIAL HISTORY  .  .  Tobacco  history:Never smoked  .  Marland Kitchen  Military History  Have you or anyone in your family served in the [employer] in any  capacity?Yes  If YES, describe whosibling  .  Marland Kitchen  Depression Screening  Depression Screening FindingsNegative  .  Marland Kitchen  Sexual history  Never sexually active  .  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  .  .  Exercise: Was competing in [employer], exercising daily.  .  .  Alcohol  Socially 1 glass, once/month  .  Marland Kitchen  Abuse/Neglect  Do you feel unsafe in your relationships?No  Have you ever been hit, kicked, punched or otherwise hurt by  someone in the past year? No  PlanPatient states they feel safe to return home  .  Marland Kitchen  Illicit drugs: Denies.  Marland Kitchen  Marland Kitchen  Seat Belts: always.  .  .  Lives with: Parents.  .  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 Lives at home  with parents.  Marland Kitchen  REVIEW OF SYSTEMS  .  Constitutional: Reports mild weight loss. Denies fever, or  fatigue.Gastrointestinal: See HPI. Cardiovascular: No heart  disease, chest pain, angina, palpitations, shortness of breath,  swelling of feet, ankles, or hands.Neurological: No frequent  headaches,  lightheaded or dizziness,  numbness/tingling.Respiratory: No chronic cough/phlegm  production, spitting up blood, shortness or breath, asthma or  wheezing.Endocrine: No glandular or hormone problems, thyroid  disease, diabetes, heat/cold intolerance, frequent  urination/excessive thirst.Hematologic/lymphatic: Not slow to  heal after cuts. No bleeding or bruising easily, anemia,  phlebitis, enlargement of glands, or past blood  transfusion.Integumentary: No rash or itching, change in color of  skin, change in hair or nails.Genitourinary: No frequent  urination, burning or painful urination, blood in urine,  incontinence.Musculoskeletal: No joint pain, muscle aches, or  joint swelling.  Marland Kitchen  VITAL SIGNS  .  Pain scale 0, Ht-in 66.34, Wt-lbs 160, BMI 25.56, BP 119/71, BSA  1.84, O2 100%RA, Ht-cm 168.5, Wt-kg 72.58, Wt Change -3.4  lb2/22/21 - weight down 3.4 lbs since last visit.  Marland Kitchen  PHYSICAL EXAMINATION  .  Constitutional: Well-developed; alert, oriented; no acute  distress.Skin: No rashes; no telangiectasias.HEENT: Conjunctiva  noninjected; no scleral icterus; no oral lesions; mucosa moist;  teeth in good repair; neck supple; no cervical  lymphadenopathy.Integumentary: No rashes, jaundice, palmar  erythema.Cardiovascular: Regular rate and rhythm; S1 and S2  auscultated; no peripheral edema.Respiratory: Clear to  auscultation throughout; no respiratory  distress.Gastrointestinal: Abdomen soft, non-tender,  non-distended; no palpable masses; no obvious hernias.Rectal  exam: Defer.Neurologic: Appropriate; normal gait; no focal  deficits.Upper Musculoskeletal: No atrophy; no edema; no  clubbing.Lower Musculoskeletal: No atrophy; no  edema.Lymphatics/hematologic/immunologic: No cervical  lymphadenopathy.Psychiatric: Well-groomed, appropriately dressed.  Alert and oriented X 3; mood appropriate; good eye contact;  memory intact.  .  ASSESSMENTS  .  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 Mangan continues  to have 6-7  loose to watery bowel movements per day. She does  have hurry, and urgency and 1-2 nocturnal stools per night  despite use of Imodium. Denies N/V, ABD pain, hematochezia,  melena. Reports weight loss (3.4 lbs since last visit per EMR).  Colonoscopy 11/03/2019 revealed lymphocytic colitis. Patient  denies NSAIDs, PPIs, smoking. Will treat with Budesonide 9 mg  daily for 8 weeks. Then, plan to taper to 6 mg for 2 weeks, then  3 mg for 2 weeks, then discontinue therapy. If not in clinical  remission at 8 weeks, or if symptoms recur on tapering, will plan  to continue budesonide at 9 mg daily for 12 weeks or longer  before tapering. Patient may use Colestipol before bedtime for  nocturnal symptoms until improved on Budesonide.  .  Celiac disease - K90.0, Ms. Goodpasture reportedly continues on  strict gluten-free diet. Discussed EGD findings, and that these  findings may indicate unintentional gluten intake. Will have her  look for hidden sources of gluten in her diet. I will also refer  her to Consuella Lose, (dietician) for assistance with this.  Health maintenance: Prevnar-13, Pneumovax-23 received DEXA 2021 -  BMD WNL  .  TREATMENT  .  Lymphocytic colitis  Start Budesonide Capsule Delayed Release Particles, 3 MG, 3  capsules, Orally, Once a day,  30 day(s), 90 Capsule, Refills 2  Start Colestipol HCl Tablet, 1 GM, 2 tablets, Orally, Once a day,  30 day(s), 60, Refills 1  Notes: 1) Start Budesonide today for 8 week course, then taper.  2) Take 2 gm of Colestipole before bedtime. 3) If the Colestipol  does not improve nocturnal symptoms, ou try Imodium 4 mg dose  prior to bedtime.  .  FOLLOW UP  .  2 Months  .  Marland Kitchen  Appointment Provider: Larwance Sachs, NP  .  Electronically signed by Larwance Sachs on 11/20/2019  at 05:12 PM EST  .  Document electronically signed by Larwance Sachs    .

## 2019-11-18 NOTE — Progress Notes (Signed)
* * *      Willmott, Scharlene **DOB:** May 30, 1996 (23 yo F) **Acc No.** [id number] **DOS:**  11/18/2019    ---       [name], Otto**    ------    88 Y old Female, DOB: 1996/08/18    [address] [place], [place], [place] [address]    Home: [phone]    Provider: Lucille Passy        * * *    Telephone Encounter    ---    Answered by  Lajuana Carry Date: 11/18/2019       Time: 05:37 PM    Reason  CHECK OUT LIST    ------            Message                     1. 3m fu booked 05/04                    * * *                ---          * * *         Provider: Lucille Passy 11/18/2019    ---    Note generated by eClinicalWorks EMR/PM Software ([email or web address])

## 2019-11-19 ENCOUNTER — Ambulatory Visit

## 2019-11-19 ENCOUNTER — Ambulatory Visit: Admitting: Gastroenterology

## 2019-11-19 NOTE — Progress Notes (Signed)
* * *      Flannigan, Baila **DOB:** 1996-01-06 (23 yo F) **Acc No.** [id number] **DOS:**  11/19/2019    ---       [name], Arlett**    ------    85 Y old Female, DOB: 29-Jun-1996    [address] [place], [place], [place] [address]    Home: [phone]    Provider: Lucille Passy        * * *    Telephone Encounter    ---    Answered by  Lucille Passy Date: 11/19/2019       Time: 03:53 PM    Reason  Dietician referral    ------            Message                     Juliann Pulse, I read your note on Ms. Culhane.  Her biopsies of the upper GI tract are consistent with uncontrolled celiac disease.  If she has not already touch base with nutrition it is reasonable to have her see them again.  She might have improvement of her lymphocytic colitis if she has better control over her celiac disease.                Action Taken                     Lucille Passy 11/19/2019 3:54:22 PM >      ULERY,SUSAN  11/20/2019 9:12:38 AM > Rolm Gala, please assist this pt with a referral to Consuella Lose (dietician) for nutritional counseling for celiac disease. I have sent the pt a portal message to let her know about this recommendation. Thank you.      Ruiz,Erik  11/20/2019 10:22:07 AM > called the dept no answer  lvm and faxed form                    * * *                ---          * * *         Provider: Lucille Passy 11/19/2019    ---    Note generated by eClinicalWorks EMR/PM Software ([email or web address])

## 2019-11-21 ENCOUNTER — Ambulatory Visit

## 2019-12-28 ENCOUNTER — Ambulatory Visit: Admitting: Gastroenterology

## 2019-12-28 NOTE — Progress Notes (Signed)
 * * *    Lizaola, Laverne **DOB:** 10-08-1995 (24 yo F) **Acc No.** [id number] **DOS:**  12/28/2019    ---       [name], Lanetra**    ------    66 Y old Female, DOB: [birth year]    [address], [place], [place] [address]    Home: [phone]    Provider: Lucille Passy        * * *    Web Encounter    ---    Answered by  Cleda Mccreedy Date: 12/28/2019       Time: 03:11 PM    Caller  Helma Franzel    ------            Reason  New Refill Request            Message                     ***Start of Medication List: ***       Budesonide 3 MG 3 capsules  Orally once a day 30 day(s)              #90 Capsule  with 2 refill(s)      ***End of Med List ***       Comments:                Action Taken                     [name],[name]  12/29/2019 8:25:59 AM > Last fill 02/23; next clinic appt 05/04; rx tab prefilled      Travis Purk V 12/29/2019 9:15:10 AM > Darl Pikes, I'd check in with her to see how her taper is going      [facility]  12/30/2019 8:21:36 AM > Left voicemail for patient at [phone] to return call, and also noted that I would send her a message through the portal. Sent patient portal message with plan taper Budesonide, but that I would like to hear how her symptoms are prior to starting taper. Once I connect with her, I plan to decrease Budesonide 9 mg to every other day for 2 weeks, then to every 3 days for 2 weeks, then stop. Add Colestipol PRN for taper and for after taper.      ULERY,SUSAN  12/31/2019 1:57:01 PM > spoke with patient's mom. She did forward pt my voicemail from yesterday, and she will let her know I called again today.      ULERY,SUSAN  01/06/2020 4:18:51 PM > lvm for pt to return call or message through portal                Refills Continue Budesonide Capsule Delayed Release Particles, 3 MG, Orally,  90 Capsule, 3 capsules, Once a day, 30 days, Refills=2    ------          * * *         **eMessages**  From:  Larwance Sachs    ------    Created:  2019-12-30 08:30:03     Sent:  2019-12-30 08:30:04    Subject:  RE:New Refill Request    Message:     Dear [name],    How are you doing on the budesonide? Please let me know how your symptoms are.  It is time for use to start the taper of the medication, but want to know how  you are feeling first. Hopefully we can connect today or tomorrow via  telephone. [phone]  [phone]. If not, please send me an update through here.    Thank you,    Larwance Sachs, NP-C                      * * *        ---      Reason for Appointment    ---     1\. New Refill Request    ---     Assessments    ---    1\. Lymphocytic colitis - K52.89, Ms. Bryand continues to have 6-7 loose  to watery bowel movements per day. She does have hurry, and urgency and 1-2  nocturnal stools per night despite use of Imodium. Denies N/V, ABD pain,  hematochezia, melena. Reports weight loss (3.4 lbs since last visit per EMR).  Colonoscopy 11/03/2019 revealed lymphocytic colitis. Patient denies NSAIDs,  PPIs, smoking. Will treat with Budesonide 9 mg daily for 8 weeks. Then, plan  to taper to 6 mg for 2 weeks, then 3 mg for 2 weeks, then discontinue therapy.  If not in clinical remission at 8 weeks, or if symptoms recur on tapering,  will plan to continue budesonide at 9 mg daily for 12 weeks or longer before  tapering. Patient may use Colestipol before bedtime for nocturnal symptoms  until improved on Budesonide.    ---     Treatment    ---      **1\. Lymphocytic colitis**    Continue Budesonide Capsule Delayed Release Particles, 3 MG, 3 capsules,  Orally, Once a day, 30 days, 90 Capsule, Refills 2    ---          * * *         Provider: Lucille Passy 12/28/2019    ---    Note generated by eClinicalWorks EMR/PM Software ([email or web address])

## 2020-01-06 ENCOUNTER — Ambulatory Visit: Admitting: Gerontology

## 2020-01-06 NOTE — Progress Notes (Signed)
 * * *    [name], [name] [name] **DOB:** 1996/05/19 (24 yo F) **Acc No.** [id number] **DOS:**  01/06/2020    ---       [name], [name]**    ------    61 Y old Female, DOB: [birth year]    [address] [place], [place], [place] [address]    Home: [phone]    Provider: Larwance Sachs        * * *    Web Encounter    ---    Answered by  Larwance Sachs Date: 01/06/2020       Time: 08:45 PM    Caller  Jakelin Whitehouse    ------            Reason  Re:RE:New Refill Request            Message                      Addressed To: Julio Zappia             Hi,                         My symptoms were getting better on the Budesonide. I have been going to the bathroom a few times a day as opposed to seven or more times. I did however run out of the medication about a week ago.                          - [name]                                                    _____________________________________________________________________            To :Lada Mago            From :General            Sent :12/30/2019 08:30 AM            Subject :RE:New Refill Request                        Dear [name], How are you doing on the budesonide? Please let me know how your symptoms are. It is time for use to start the taper of the medication, but want to know how you are feeling first. Hopefully we can connect today or tomorrow via telephone. [phone]. If not, please send me an update through here. Thank you, Larwance Sachs, NP-C                            Action Taken                     [facility]  01/07/2020 9:29:49 AM >      Candance Bohlman  01/07/2020 11:39:58 AM > "Dear [name], Thank you for responding with your symptoms. I just sent in a new prescription of the Budesonide for you. I would like you take 3 tablets (9 mg) every other day for 2 weeks. Then, decrease again to 3 tablets every three days for 2 weeks. Then stop the medication. Please let me know if you have questions. I look forward to seeing you in May for your  follow-up visit.             Warmly,             Larwance Sachs, NP-C"                      Refills Continue Budesonide Capsule Delayed Release Particles, 3 MG, Orally,  45, 3 capsules, Once every other day, 30 days, Refills=1    ------          * * *         **eMessages**  From:  Larwance Sachs    ------    Created:  2020-01-07 11:39:54    Sent:  2020-01-07 11:40:26    Subject:  RE:Re:RE:New Refill Request    Message:     Dear [name],    Thank you for responding with your symptoms. I just sent in a new prescription  of the Budesonide for you. I would like you take 3 tablets (9 mg) every other  day for 2 weeks. Then, decrease again to 3 tablets every three days for 2  weeks. Then stop the medication. Please let me know if you have questions. I  look forward to seeing you in May for your follow-up visit.    Warmly,    Larwance Sachs, NP-C                        ---          * * *         Provider: Larwance Sachs 01/06/2020    ---    Note generated by eClinicalWorks EMR/PM Software ([email or web address])

## 2020-01-07 MED ORDER — Budesonide: 3 | 45 | 1 refills | 0 days | Status: AC

## 2020-01-14 ENCOUNTER — Ambulatory Visit: Admitting: Gastroenterology

## 2020-01-14 NOTE — Progress Notes (Signed)
* * *      Minichiello, Daeja **DOB:** [birth year] (24 yo F) **Acc No.** [id number] **DOS:**  01/14/2020    ---       [name], Teara**    ------    9 Y old Female, DOB: 1996-03-23    [address] [place], [place], [place] [address]    Home: [phone]    Provider: Lucille Passy        * * *    Telephone Encounter    ---    Answered by  Cleda Mccreedy Date: 01/14/2020       Time: 03:25 PM    Reason  05/04 reschedule    ------            Message                     Called, spoke to pt's mom. Informed 05/04 appt will need to be rescheduled due to Dr. Revonda Standard covering inpatient. Appt rescheduled to 05/07 @ 11.30am. Asked pt to call back to confirm                Action Taken                     [name],[name]  01/14/2020 3:27:05 PM >                     * * *                ---          * * *         Provider: Lucille Passy 01/14/2020    ---    Note generated by eClinicalWorks EMR/PM Software ([email or web address])

## 2020-01-30 ENCOUNTER — Ambulatory Visit: Admit: 2020-01-30 | Payer: PPO

## 2020-01-30 ENCOUNTER — Ambulatory Visit (HOSPITAL_BASED_OUTPATIENT_CLINIC_OR_DEPARTMENT_OTHER): Admitting: Psychiatry

## 2020-01-30 ENCOUNTER — Ambulatory Visit: Admitting: Gerontology

## 2020-01-30 ENCOUNTER — Ambulatory Visit: Admitting: Gastroenterology

## 2020-01-30 MED ORDER — Colestipol HCl: 1 | 120 | Freq: Two times a day (BID) | 2 refills | 0 days | Status: AC

## 2020-01-30 NOTE — Progress Notes (Signed)
 .  Progress Notes  .  Patient: [name] [name]  Provider: ULERY, SUSAN    .  DOB:27-Nov-1995 Age: 24 Y Sex: Female  .  PCP: Jonathon Bellows    Date: 01/30/2020  .  --------------------------------------------------------------------------------  .  REASON FOR APPOINTMENT  .  1. IBS  .  HISTORY OF PRESENT ILLNESS  .  Ambulatory Falls and Injury Prevention:  HPI  .  Marland Kitchen  Have you experienced a fall in the past year?No  Is the patient using assistive devices such as a cane or  walker?No  Do you need assistance with ambulation while at our facility?No  Interventionsnone, patient not a fall risk  .  GENERAL:  24 y/o female with PMH significant celiac  disease who presents to the [facility] today for follow-up for  loose stools. Recent EGD showed evidence of celiac disease  suggesting hidden sources of gluten in her diet, and recent  colonoscopy notable for lymphocytic colitis. She was last seen in  the GI Clinic 11/18/2019, at which time she continued with 6-7 BMs  per day, all loose; no blood and no black stools. She reported  some hurry, urgency, and nocturnal bowel movements. Budesonide 9  mg daily was initiated, with Colestipol PRN until improvement on  Budesonide. Plan for an 8 week course of Budesonide before  attempt to taper. She also noted occasional N/V, infrequent ABD  pain, and her weight was down 3.4 lbs since prior visit. Appetite  good. No NSAIDs, PPIs, smoking.- Labs 10/21/2019 CMP, CBC, fecal  calprotectin, stool studies unremarkable - EGD 11/03/2019 -  endoscopically normal; duodenum with increased intraepithelial  lymphocytes and patchy mild villous architectural changes on  histology. - Colonoscopy 11/03/2019 - endoscopically normal; ileal  mucosa with increase intraepithelial lymphocytes on histology;  colon with lymphocytic colitis on histology- DEXA 10/24/2019 - BMD  within expected limits for age.Received pneumonia vaccines.  Today, Ms. Rapaport has completed an 8 week course of 9 mg  Budesonide,  and in the final week of her taper. Currently taking  6 mg of Budesonide every other day. She did have a 1 week period  during which she ran out of the medication. Since starting the  taper, she has had mild increase in bowel movement frequency from  2 BMs/ day to 3/day. Stool caliber has become soft-loose most  days. Denies hurry, urgency, nocturnal BMs, hematochezia, melena,  ABD pain. Her nausea/vomiting has resolved, and her weight is  stable. She has not connected with Consuella Lose (dietician), but  is working with an outside dietician. She believes the hidden  source of gluten may have been from lunches she was eating at  work which she was told were gluten free. She started bringing  her own lunch.  .  CURRENT MEDICATIONS  .  Taking Budesonide 3 MG Capsule Delayed Release Particles 3  capsules Orally Once every other day, Notes: tapering off will  complete in 2 days (02/01/20)  Taking Colestipol HCl 1 GM Tablet 2 tablets Orally Once a day  Medication List reviewed and reconciled with the patient  .  PAST MEDICAL HISTORY  .  Celiac disease - adherent to gluten free diet  Depression/anxiety - diagnosed during Freshman year of college,  never on medication  Left shoulder labral tear  Left knee injury - medial and lateral menisus tears, ACL tear  Chondromalacia patellae  Right ankle sprain  Lymphocytic colitis  .  ALLERGIES  .  N.K.D.A.  .  SURGICAL HISTORY  .  Left ACL tear, lateral and medial meniscus injury repair. Feb  2013  Left shoulder labral repair 05/14  Left medial meniscus repair 06/15  EGD 10/2019  Colonoscopy 10/2019  .  FAMILY HISTORY  .  Mother: alive, diagnosed with Hypertension  Father: alive  Maternal Grand Mother: Depression  Maternal Grand Father: deceased, Diabetes mellitus without  mention of complication, type II or unspecified type, not stated  as uncontrolled, Hypertension  Siblings: Sister - depression, anxiety Twin brothers, healthy  Denies early CAD, CVA, thyroid d/o, substance  abuseDenies h/o  breast, colon, ovarian, skin cancersDenies f/h of CRC, thyroid  disease, or celiac diseaseSister with gastroparesis.  .  SOCIAL HISTORY  .  .  Tobacco  history:Never smoked  .  Marland Kitchen  Military History  Have you or anyone in your family served in the [employer] in any  capacity?Yes  If YES, describe whosibling  .  Marland Kitchen  Depression Screening  Depression Screening FindingsNegative  .  Marland Kitchen  Sexual history  Never sexually active  .  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  .  .  Exercise: Was competing in [employer], exercising daily.  .  .  Alcohol  Socially 1 glass, once/month  .  Marland Kitchen  Abuse/Neglect  Do you feel unsafe in your relationships?No  PlanPatient states they feel safe to return home  Have you ever been hit, kicked, punched or otherwise hurt by  someone in the past year? No  .  .  Illicit drugs: Denies.  .  .  Seat Belts: always.  .  .  Lives with: Parents.  .  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 Lives at home  with parents.  .  HOSPITALIZATION/MAJOR DIAGNOSTIC PROCEDURE  .  No Hospitalization History.  Marland Kitchen  REVIEW OF SYSTEMS  .  Constitutional: No recent weight change, fever, or  fatigue.Gastrointestinal: See HPI. Cardiovascular: No heart  disease, chest pain, angina, palpitations, shortness of breath,  swelling of feet, ankles, or hands.Neurological: No frequent  headaches, lightheaded or dizziness,  numbness/tingling.Respiratory: No chronic cough/phlegm  production, spitting up blood, shortness or breath, asthma or  wheezing.Endocrine: No glandular or hormone problems, thyroid  disease, diabetes, heat/cold intolerance, frequent  urination/excessive thirst.Hematologic/lymphatic: Not slow to  heal after cuts. No bleeding or bruising easily, anemia,  phlebitis, enlargement of glands, or past blood  transfusion.Integumentary: No rash or itching, change in  color of  skin, change in hair or nails.Genitourinary: No frequent  urination, burning or painful urination, blood in urine,  incontinence.Musculoskeletal: No joint pain, muscle aches, or  joint swelling.  Marland Kitchen  VITAL SIGNS  .  Pain scale 0, Ht-in 66.34, Wt-lbs 143, BMI 22.84, BP 119/76, HR  81, BSA 1.74, O2 100%RA, Ht-cm 168.5, Wt-kg 64.86, Wt Change -17  lb.  .  PHYSICAL EXAMINATION  .  Constitutional: Well-developed; alert, oriented; no acute  distress.Skin: No rashes; no telangiectasias.HEENT: Conjunctiva  noninjected; no scleral icterus; no oral lesions; mucosa moist;  teeth in good repair; neck supple; no cervical  lymphadenopathy.Integumentary: No rashes, jaundice, palmar  erythema.Cardiovascular: Regular rate and rhythm; S1 and S2  auscultated; no peripheral edema.Respiratory: Clear to  auscultation throughout; no respiratory  distress.Gastrointestinal: Abdomen soft, non-tender,  non-distended; no palpable masses; no obvious hernias.Rectal  exam: Defer.Neurologic: Appropriate; normal gait; no focal  deficits.Upper Musculoskeletal: No atrophy; no edema; no  clubbing.Lower Musculoskeletal: No  atrophy; no  edema.Lymphatics/hematologic/immunologic: No cervical  lymphadenopathy.Psychiatric: Well-groomed, appropriately dressed.  Alert and oriented x3; mood appropriate; good eye contact; memory  intact.  .  ASSESSMENTS  .  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 Sochacki was having  6-7 loose to watery bowel movements per day, often with hurry,  urgency and 1-2 nocturnal stools per night despite use of  Imodium. No hematochezia, melena. She did have some weight loss  (3.4 lbs since last visit per EMR). No NSAIDs, PPIs, smoking.  Colonoscopy 11/03/2019 revealed lymphocytic colitis. An 8-week  course of Budesonide 9 mg once daily was initiated, with a slow  taper. She is now in the last few days of her taper. Symptoms  largely resolved on Budesonide, with typically 2 soft formed  bowel movements daily. The hurry,  urgency and nocturnal symptoms  resolved. Since initiating taper, she has had a slight up-tick in  symptoms reportedly having 2-3 soft to loose bowel movements  daily. Denies hurry, urgency, nocturnal symptoms, hematochezia.  Denies n/v, weight loss. Will continue with taper, and have her  take Colestipol 1-2 g daily PRN for loose stools. Will follow-up  in 6 months. She knows to contact the clinic sooner if symptoms  recur.  .  Celiac disease - K90.0, Ms. Tiner reportedly continues on  strict gluten-free diet. Discussed EGD findings, and that these  findings may indicate unintentional gluten intake. Will have her  look for hidden sources of gluten in her diet. I will also refer  her to Consuella Lose, (dietician) for assistance with this.  Health maintenance: Prevnar-13, Pneumovax-23 received DEXA 2021 -  BMD WNL working with dietician  .  20 minutes was spent during this follow-up encounter conducting a  full history, reviewing current medications, previous objective  testing and laboratory studies, discussing future testing and  treatment options, providing patient education related to  patient's GI conditions, answering questions, and coordinating  care.  .  TREATMENT  .  Lymphocytic colitis  Start Colestipol HCl Tablet, 1 GM, 2 tablets, Orally, twice  daily, 30 day(s), 120, Refills 2  Notes: 1) Start with 1 gm (1 tablet) twice daily as needed for  frequent loose stools. If still having frequent loose stools,  increase to 2 gm (2 tablets).  .  FOLLOW UP  .  6 Months  .  Marland Kitchen  Appointment Provider: Larwance Sachs, NP  .  Electronically signed by Larwance Sachs on 01/30/2020  at 02:59 PM EDT  .  Document electronically signed by Larwance Sachs    .

## 2020-01-30 NOTE — Progress Notes (Signed)
 * * *    [name] [name] **DOB:** 21-Oct-1995 (24 yo F) **Acc No.** [id number] **DOS:**  01/30/2020    ---       [name] [name], [name] [name]**    ------    24 Y old Female, DOB: 1995/10/02, External MRN: [id number]    Account Number: [id number]    [address] [name] [name], QI-[id number]    Home: [phone]    Insurance: [name] [name]    PCP: [name] [name] Referring: [name] [name]    Appointment Facility[facility]        * * *    01/30/2020  **Appointment Provider:** [name] Sachs, NP **CHN#:** [id number]    ------    ---       **Current Medications**    ---    Taking    * Budesonide 3 MG Capsule Delayed Release Particles 3 capsules Orally Once every other day, Notes: tapering off will complete in 2 days (02/01/20)    ---    * Colestipol HCl 1 GM Tablet 2 tablets Orally Once a day    ---    * Medication List reviewed and reconciled with the patient    ---      **Past Medical History**    ---      Celiac disease - adherent to gluten free diet.        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        ---    Left shoulder labral tear.        ---    Left knee injury - medial and lateral menisus tears, ACL tear.        ---    Chondromalacia patellae.        ---    Right ankle sprain.        ---    Lymphocytic colitis.        ---      **Surgical History**    ---      Left ACL tear, lateral and medial meniscus injury repair. Feb 2013    ---    Left shoulder labral repair 05/14    ---    Left medial meniscus repair 06/15    ---    EGD 10/2019    ---    Colonoscopy 10/2019    ---      **Family History**    ---      Mother: alive, diagnosed with Hypertension    ---    Father: alive    ---    Maternal Grand Mother: Depression    ---    Maternal Grand Father: deceased, Diabetes mellitus without mention of  complication, type II or unspecified type, not stated as uncontrolled,  Hypertension    ---    Siblings: Sister - depression, anxiety Twin brothers, healthy    ---    Denies early  CAD, CVA, thyroid d/o, substance abuse    Denies h/o breast, colon, ovarian, skin cancers    Denies f/h of CRC, thyroid disease, or celiac disease    Sister with gastroparesis.    ---      **Social History**    ---    Tobacco    history: _Never smoked_    Military History    Have you or anyone in your family served in the [employer] in any capacity?  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    Exercise: Was competing in [employer], exercising daily.    Alcohol    _Socially 1 glass, once/month_    Abuse/Neglect    Do you feel unsafe in your relationships? _No_    Plan _Patient states they feel safe to return home_    Have you ever been hit, kicked, punched or otherwise hurt by someone in the  past year? _No_    Illicit drugs: Denies.    Seat Belts: always.    Lives with: Parents.  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 Lives at home with parents.    ---      **Allergies**    ---      N.K.D.A.    ---      **Hospitalization/Major Diagnostic Procedure**    ---      No Hospitalization History.    ---      **Review of Systems**    ---    Constitutional: No recent weight change, fever, or fatigue.    Gastrointestinal: See HPI.    Cardiovascular: No heart disease, chest pain, angina, palpitations, shortness  of breath, swelling of feet, ankles, or hands.    Neurological: No frequent headaches, lightheaded or dizziness,  numbness/tingling.    Respiratory: No chronic cough/phlegm production, spitting up blood, shortness  or breath, asthma or wheezing.    Endocrine: No glandular or hormone problems, thyroid disease, diabetes,  heat/cold intolerance, frequent urination/excessive thirst.     Hematologic/lymphatic: Not slow to heal after cuts. No bleeding or bruising  easily, anemia, phlebitis, enlargement of glands, or past blood transfusion.    Integumentary: No rash or itching, change in color of skin, change in hair or  nails.    Genitourinary: No frequent urination, burning or painful urination, blood in  urine, incontinence.    Musculoskeletal: No joint pain, muscle aches, or joint swelling.       **Reason for Appointment**    ---      1\. IBS    ---      **Assessments**    ---    1\. Lymphocytic colitis - K52.89 (Primary), [name] [name] was having 6-7  loose to watery bowel movements per day, often with hurry, urgency and 1-2  nocturnal stools per night despite use of Imodium. No hematochezia, melena.  She did have some weight loss (3.4 lbs since last visit per EMR). No NSAIDs,  PPIs, smoking. Colonoscopy 11/03/2019 revealed lymphocytic colitis. An 8-week  course of Budesonide 9 mg once daily was initiated, with a slow taper. She is  now in the last few days of her taper. Symptoms largely resolved on  Budesonide, with typically 2 soft formed bowel movements daily. The hurry,  urgency and nocturnal symptoms resolved. Since initiating taper, she has had a  slight up-tick in symptoms reportedly having 2-3 soft to loose bowel movements  daily. Denies hurry, urgency, nocturnal symptoms, hematochezia. Denies n/v,  weight loss. Will continue with taper, and have her take Colestipol 1-2 g  daily PRN for loose stools. Will follow-up in 6 months. She knows to contact  the clinic sooner if symptoms recur.    ---  2\. Celiac disease - K90.0, [name] [name] reportedly continues on strict  gluten-free diet. Discussed EGD findings, and that these findings may indicate  unintentional gluten intake. Will have her look for hidden sources of gluten  in her diet. I will also refer her to Consuella Lose, (dietician) for  assistance with this. Health maintenance: Prevnar-13, Pneumovax-23 received  DEXA 2021 -  BMD WNL working with dietician    ---     20 minutes was spent during this follow-up encounter conducting a full  history, reviewing current medications, previous objective testing and  laboratory studies, discussing future testing and treatment options, providing  patient education related to patient's GI conditions, answering questions, and  coordinating care.    ---      **Treatment**    ---      **1\. Lymphocytic colitis**    Start Colestipol HCl Tablet, 1 GM, 2 tablets, Orally, twice daily, 30 day(s),  120, Refills 2    Notes: 1) Start with 1 gm (1 tablet) twice daily as needed for frequent loose  stools. If still having frequent loose stools, increase to 2 gm (2 tablets).    ---     **Follow Up**    ---    6 Months      **History of Present Illness**    ---     _Ambulatory Falls and Injury Prevention_ :    HPI    Have you experienced a fall in the past year? _No_    Is the patient using assistive devices such as a cane or walker? _No_    Do you need assistance with ambulation while at our facility? _No_    Interventions _none, patient not a fall risk_     _GENERAL_ :    24 y/o female with PMH significant celiac disease who presents to the [facility] today for follow-up for loose stools. Recent EGD showed evidence of  celiac disease suggesting hidden sources of gluten in her diet, and recent  colonoscopy notable for lymphocytic colitis. She was last seen in the GI  Clinic 11/18/2019, at which time she continued with 6-7 BMs per day, all loose;  no blood and no black stools. She reported some hurry, urgency, and nocturnal  bowel movements. Budesonide 9 mg daily was initiated, with Colestipol PRN  until improvement on Budesonide. Plan for an 8 week course of Budesonide  before attempt to taper. She also noted occasional N/V, infrequent ABD pain,  and her weight was down 3.4 lbs since prior visit. Appetite good. No NSAIDs,  PPIs, smoking.    - Labs 10/21/2019 CMP, CBC, fecal calprotectin, stool studies  unremarkable    - EGD 11/03/2019 - endoscopically normal; duodenum with increased  intraepithelial lymphocytes and patchy mild villous architectural changes on  histology.    - Colonoscopy 11/03/2019 - endoscopically normal; ileal mucosa with increase  intraepithelial lymphocytes on histology; colon with lymphocytic colitis on  histology    - DEXA 10/24/2019 - BMD within expected limits for age.    Received pneumonia vaccines.    Today, [name] [name] has completed an 8 week course of 9 mg Budesonide, and  in the final week of her taper. Currently taking 6 mg of Budesonide every  other day. She did have a 1 week period during which she ran out of the  medication. Since starting the taper, she has had mild increase in bowel  movement frequency from 2 BMs/ day to 3/day. Stool caliber has become soft-  loose most days. Denies hurry, urgency, nocturnal BMs, hematochezia, melena,  ABD pain. Her nausea/vomiting has resolved, and her weight is stable.    She has not connected with Consuella Lose (dietician), but is working with an  outside dietician. She believes the hidden source of gluten may have been from  lunches she was eating at work which she was told were gluten free. She  started bringing her own lunch.      **Vital Signs**    ---    Pain scale 0, Ht-in 66.34, Wt-lbs 143, BMI 22.84, BP 119/76, HR 81, BSA 1.74,  O2 100%RA, Ht-cm 168.5, Wt-kg 64.86, Wt Change -17 lb.      **Physical Examination**    ---    Constitutional: Well-developed; alert, oriented; no acute distress.    Skin: No rashes; no telangiectasias.    HEENT: Conjunctiva noninjected; no scleral icterus; no oral lesions; mucosa  moist; teeth in good repair; neck supple; no cervical lymphadenopathy.    Integumentary: No rashes, jaundice, palmar erythema.    Cardiovascular: Regular rate and rhythm; S1 and S2 auscultated; no peripheral  edema.    Respiratory: Clear to auscultation throughout; no respiratory distress.    Gastrointestinal: Abdomen soft,  non-tender, non-distended; no palpable masses;  no obvious hernias.    Rectal exam: Defer.    Neurologic: Appropriate; normal gait; no focal deficits.    Upper Musculoskeletal: No atrophy; no edema; no clubbing.    Lower Musculoskeletal: No atrophy; no edema.    Lymphatics/hematologic/immunologic: No cervical lymphadenopathy.    Psychiatric: Well-groomed, appropriately dressed. Alert and oriented x3; mood  appropriate; good eye contact; memory intact.    **Appointment Provider:** [name] Sachs, NP    Electronically signed by [name] [name] on 01/30/2020 at 02:59 PM EDT    Sign off status: Completed        * * *        GI Clinic    [address] [place], [address]    [place], [place] [address]    Tel: [phone]    Fax: [phone]              * * *          Progress Note: [name] Sachs, NP 01/30/2020    ---    Note generated by eClinicalWorks EMR/PM Software ([email or web address])

## 2020-04-27 ENCOUNTER — Ambulatory Visit: Admitting: Internal Medicine

## 2020-04-27 NOTE — Progress Notes (Signed)
* * *      Boehlke, Tona **DOB:** 1996/07/09 (24 yo F) **Acc No.** [id number] **DOS:**  04/27/2020    ---       [name], [name]**    ------    47 Y old Female, DOB: [birth year]    Account Number: [id number]    [name], [place], [id number]    Home: [phone]    Guarantor: Mariella Saa Insurance: BCBS PPO Payer ID: [id number]    Appointment Facility: [facility]        * * *    04/27/2020 Genia Plants, MD    ------    ---        ---       **Reason for Appointment**    ---      1\. Tetanus booster    ---      **Assessments**    ---    1\. Diphtheria-tetanus-pertussis (DTP) vaccination - Z23 (Primary)    ---      **Procedure Codes**    ---      10932 TDAP VACCINE >7 IM    ---    35573 IMMUNIZATION ADMIN, Modifiers: 8    ---     **Follow Up**    ---    prn    Electronically signed by Genia Plants , M.D. on 06/21/2020 at 04:47 PM EDT    Sign off status: Completed        * * [facility]    [address]    [place], [place] [address]    Tel: [phone]    Fax: [phone]              * * *          Progress Note: Genia Plants, MD 04/27/2020    ---    Note generated by eClinicalWorks EMR/PM Software ([email or web address])

## 2020-04-27 NOTE — Progress Notes (Signed)
* * *      [name], [name] **DOB:** [birth year] (24 yo F) **Acc No.** [id number] **DOS:**  04/27/2020    ---       [name] [name], [name] [name]**    ------    24 Y old Female, DOB: 04-30-1996    [name], [place], [place], [place] [address]    Home: [phone]    Provider: Genia Plants        * * *    Telephone Encounter    ---    Answered by  [name] [name] Date: 04/27/2020       Time: 10:40 AM    Caller  patient    ------            Reason  lab            Message                     needs lab test for chicken pox? titers            [phone]                Action Taken                     [name] [name]  04/27/2020 10:52:55 AM EDT > pt needs lab appt - V, when you tell patient to come pick up letter, also let her know she will need labwork at that time with lab orders already in the system; can you let front desk know to schedule patient please? Thank you V :) :) :)      [name],[name]  04/27/2020 11:05:58 AM EDT > ALL SET THANK YOU                     * * *              * * *        ---      Reason for Appointment    ---     1\. Lab    ---     Assessments    ---    1\. Unknown varicella vaccination status - Z78.9 (Primary)    ---     Treatment    ---      **1\. Unknown varicella vaccination status**    _LAB: Varicella IgG (VZOS)_    _LAB: MMR (IGG) PANEL (MEASLES, MUMPS, RUBELLA)_    _LAB: MUMPS_    _LAB: RUB SCRN_    _LAB: RUBEOLA_    _LAB: HBV Hepatitis B Surface Antigen (HBSAG)_    _LAB: HBV Hepatitis B Surface Antibody (HBSAB)_    _LAB: Quantiferon_    ---      Procedure Codes    ---     70623 VARICELLA-ZOSTER ANTIBODY    ---          * * *         Provider: Genia Plants 04/27/2020    ---    Note generated by eClinicalWorks EMR/PM Software ([email or web address])

## 2020-04-27 NOTE — Progress Notes (Signed)
* * *      [name], [name] **DOB:** 06/03/1996 (24 yo F) **Acc No.** [id number] **DOS:**  04/27/2020    ---       [name] [name], [name]**    ------    67 Y old Female, DOB: [birth year]    [name], [place], [place], [place] [address]    Home: [phone]    Provider: Genia Plants        * * *    Telephone Encounter    ---    Answered by  Kristen Cardinal Date: 04/27/2020       Time: 10:43 AM    Message                     HI DR.Sriram Febles CAN YOU PLS RIGHT A LETTER FOR THIS PT THAT SHE HAD DANE THE TDAP TODAY SHE NEED THAT FOR SCHOOL THANK YOU VERY MUCH         ------            Action Taken                     Riggins Adrean Heitz 04/27/2020 11:20:09 AM>All set      Rajean Desantiago 04/27/2020 11:20:12 AM>      Frady Taddeo 04/27/2020 11:20:18 AM>                    * * *                ---          * * *         Provider: Genia Plants 04/27/2020    ---    Note generated by eClinicalWorks EMR/PM Software ([email or web address])

## 2020-04-28 ENCOUNTER — Ambulatory Visit: Admit: 2020-04-28 | Payer: PPO

## 2020-04-28 ENCOUNTER — Ambulatory Visit: Admitting: Internal Medicine

## 2020-04-28 LAB — HX HBV HEPATITIS B SURFACE ANTIGEN (HBSAG): HX HBS AG: NONREACTIVE

## 2020-04-28 LAB — HX HBV HEPATITIS B SURFACE ANTIBODY  (HBSAB): HX HBS AB: 1.6 m[IU]/mL (ref 0.0–7.9)

## 2020-04-30 LAB — HX IMMUNOLOGY
HX HEPATITIS B SURFACE ANTIBODY: 1.6 m[IU]/mL (ref 0.0–7.9)
HX HEPATITIS B SURFACE ANTIGEN: NONREACTIVE
HX MUMPS IMMUNE STATUS: IMMUNE
HX RUBELLA IGG: NON-IMMUNE/NOT IMMUNE
HX RUBEOLA IMMUNE STATUS: IMMUNE
HX V. ZOSTER IMMUNE STATUS: IMMUNE

## 2020-04-30 LAB — HX RUBEOLA: HX RUBEOLA: IMMUNE

## 2020-04-30 LAB — HX V ZOSTER: HX V ZOSTER: IMMUNE

## 2020-04-30 LAB — HX RUB SCRN: HX RUB SCRN: NON-IMMUNE/NOT IMMUNE

## 2020-04-30 LAB — HX VACCINATION SEROLOGIES: HX MUMPS IMMUNE STATUS: IMMUNE

## 2020-04-30 LAB — HX MUMPS: HX MUMPS: IMMUNE

## 2020-05-01 LAB — HX QUANTIFERON
HX QUANTIFERON-MITOGEN-NIL: >10 [IU]/mL
HX QUANTIFERON-NIL: 0.04 [IU]/mL
HX QUANTIFERON-TB PLUS, 4T: NEGATIVE
HX QUANTIFERON-TB1-NIL: 0 [IU]/mL
HX QUANTIFERON-TB2-NIL: 0 [IU]/mL

## 2020-05-02 LAB — HX IMMUNOLOGY
HX MEASLES ANTIBODY (IGM): <1:20 {titer}
HX QUANTIFERON-MITOGEN-NIL: >10 [IU]/mL
HX QUANTIFERON-NIL: 0.04 [IU]/mL
HX QUANTIFERON-TB PLUS, 4T: NEGATIVE
HX QUANTIFERON-TB1-NIL: 0 [IU]/mL
HX QUANTIFERON-TB2-NIL: 0 [IU]/mL
HX RUBELLA ANTIBODY (IGM): <20 [AU]/ml

## 2020-05-02 LAB — HX RUBELLA ANTIBODY (IGM): HX RUBELLA ANTIBODY (IGM): <20 [AU]/ml (ref <20.00)

## 2020-05-02 LAB — HX MEASLES ANTIBODY (IGM): HX MEASLES ANTIBODY (IGM): <1:20 {titer}

## 2020-05-06 ENCOUNTER — Ambulatory Visit: Admitting: Internal Medicine

## 2020-05-06 NOTE — Progress Notes (Signed)
* * *      [name] [name] **DOB:** 03/12/1996 (24 yo F) **Acc No.** [id number] **DOS:**  05/06/2020    ---       [name] [name], [name] [name]**    ------    2 Y old Female, DOB: July 13, 1996    [name], [name], [place], [place] [address]    Home: [phone]    Provider: Genia Plants        * * *    Web Encounter    ---    Answered by  Genia Plants Date: 05/06/2020       Time: 01:40 PM    Reason  make appt: chief complaint "MMR and HepB booster shots"    ------            Action Taken                     [name] [name]  05/06/2020 01:41:33 PM EDT > [name] [name]  05/06/2020 01:38:56 PM EDT > [name] [name], [name]      [name],Nicole  05/06/2020 02:27:13 PM EDT > called pt LVM.  Thank you.      [name],Nicole  05/07/2020 09:00:05 AM EDT > called pt LVM.  Thank you.      [name],Nicole  05/10/2020 03:15:43 PM EDT > called pt LVM.  Thank you                    * * *         **eMessages**  From:  [name] [name]    ------    Created:  2020-05-06 13:41:40    Sent:  2020-05-06 13:42:04    Subject:  [id number]    Message:                     Genia Plants  05/06/2020 01:41:33 PM EDT > [name] [name]  05/06/2020 01:38:56 PM EDT > [name] [name], [name]                        ---          * * *         Provider: Genia Plants 05/06/2020    ---    Note generated by eClinicalWorks EMR/PM Software ([email or web address])

## 2020-05-06 NOTE — Progress Notes (Signed)
* * *      [name], [name] **DOB:** [birth year] (24 yo F) **Acc No.** [id number] **DOS:**  05/06/2020    ---       [name] [name], [name] [name]**    ------    89 Y old Female, DOB: [birth year]    [name], [place], [place], [place] [address]    Home: [phone]    Provider: Genia Plants        * * *    Web Encounter    ---    Answered by  Genia Plants Date: 05/06/2020       Time: 01:39 PM    Reason  need MMR and HepB vacinnation    ------            Action Taken                     [name] [name]  05/06/2020 01:39:46 PM EDT > [name] [name]  05/06/2020 01:38:56 PM EDT > [name] [name], your labs show low Hepatitis B and rubella nonimmune so you would be due for repeat HepB and MMR vaccination. I will have someone in our office call you to schedule this.      [name] [name]  05/06/2020 01:42:21 PM EDT > duplicate message - see corresponding message                    * * *                ---          * * *         Provider: Genia Plants 05/06/2020    ---    Note generated by eClinicalWorks EMR/PM Software ([email or web address])

## 2020-07-01 ENCOUNTER — Ambulatory Visit: Admitting: Internal Medicine

## 2020-07-01 NOTE — Progress Notes (Signed)
* * *      [name] [name], [name] [name] **DOB:** 11/04/1995 (24 yo F) **Acc No.** [id number] **DOS:**  07/01/2020    ---       [name] [name], [name] [name]**    ------    70 Y old Female, DOB: 09/13/1996    [address] [place], [place] [address]    Home: [phone]    Provider: Margy Clarks        * * *    Telephone Encounter    ---    Answered by  Cleda Mccreedy Date: 07/01/2020       Time: 02:18 PM    Reason  Appt Reschedule    ------            Message                     Spoke to patient's mom, she confirmed reschedule for 11/05! 1pm. Reports she will have daughter to call back, as daughter recently moved to [place] and may be looking for an GI MD closer to home                 Action Taken                     [name],[name]  07/01/2020 2:20:23 PM >                     * * *                ---          * * *         Provider: Margy Clarks 07/01/2020    ---    Note generated by eClinicalWorks EMR/PM Software ([email or web address])

## 2020-12-20 NOTE — Progress Notes (Signed)
* * *        **  [name], [name]**    --- ---    47 Y old Female, DOB: 14-Apr-1996    [address], [place], [place] [address]    Home: [phone]    Provider: Genia Plants, MD        * * *    Web Encounter    ---    Answered by   Dorothy Spark  Date: 04/11/2016         Time: 01:33 PM    Caller   Aryahna Moritz    --- ---            Reason   Reschedule Appointment Request            Message                      Rescheduled From:      Date: 04/11/2016  Time: 2:20 PM Reason: NP PREV DR NEY PHYSICAL      Provider: Genia Plants Facility:[facility]       ------------------------------------------       Rescheduled To:      Appointment Type: Consult            Facility: [facility]      Provider: Genia Plants       Date Range  From: 04/23/2016    To: 04/28/2016      Day         First Preference: Tuesday    Second Preference:Thursday      Time        First Preference: Morning (8-12AM)    Second Preference:Morning (8-12AM)      Preferred Method of Contact: eMail      Email: [email or web address]      Contact Number: [phone]      Reason Of Visit: NP PREV DR NEY PHYSICAL       Reason For Reschedule:                 Action Taken   [facility] 04/12/2016 9:42:10 AM > replied on portal                * * *         **eMessages**   From:   Willow Ora    --- ---    Created:   2016-04-12 09:42:07    Sent:      Subject:   [id number] Appointment Request    Message:      [name],    Dr Conley Simmonds' next available new patient appointment is 8/14. Please give our  office a call so we can help you schedule this appointment. Our telephone  number is [phone].    Thank you,    [facility] in [place]                        ---          * * *          Patient: [name] [name], [name] [name] DOB: 03/05/1996 Provider: Genia Plants, MD  04/11/2016    ---    Note generated by eClinicalWorks EMR/PM Software ([email or web address])

## 2020-12-20 NOTE — Progress Notes (Signed)
* * *        **  Sabet, Shiann**    --- ---    13 Y old Female, DOB: 09-18-1996    [address], [place], [place] [address]    Home: [phone]    Provider: Genia Plants, MD        * * *    Web Encounter    ---    Answered by   Dorothy Spark  Date: 04/11/2016         Time: 01:33 PM    Caller   Amorie Linnen    --- ---            Reason   Cancel Appointment Request            Message                      Rescheduled From:      Date: 04/11/2016  Time: 2:20 PM Reason: NP PREV DR NEY PHYSICAL      Provider: Genia Plants Facility:[facility]       ------------------------------------------       Rescheduled To:      Appointment Type: Consult            Facility: [facility]      Provider: Genia Plants       Date Range  From: 04/23/2016    To: 04/28/2016      Day         First Preference: Tuesday    Second Preference:Thursday      Time        First Preference: Morning (8-12AM)    Second Preference:Morning (8-12AM)      Preferred Method of Contact: eMail      Email: [email or web address]      Contact Number: [phone]      Reason Of Visit: NP PREV DR NEY PHYSICAL       Reason For Reschedule:                 Action Taken   [facility] 04/12/2016 9:40:38 AM >                * * *                ---          * * *          Patient: Arciniega, Cordell DOB: 1996/05/16 Provider: Genia Plants, MD  04/11/2016    ---    Note generated by eClinicalWorks EMR/PM Software ([email or web address])

## 2020-12-20 NOTE — Progress Notes (Signed)
* * *        **  Ratz, Mahasin**    --- ---    27 Y old Female, DOB: November 26, 1995    [address] [place], [place], [place] [address]    Home: [phone]    Provider: Genia Plants, MD        * * *    Telephone Encounter    ---    Answered by   Genia Plants  Date: 03/08/2016         Time: 03:26 PM    Reason   MASSPAT    --- ---            Action Taken   Genia Plants , MD 03/08/2016 3:26:54 PM > Jill Side, just need  MASSPAT for patient. Thank you. Griffin,Colleen 03/08/2016 3:35:16 PM > scanned  into your docs. Newel Oien , MD 03/08/2016 3:58:48 PM > thank you.  MASSPAT/PMP reviewed. Pt is due for clonazepam refill. Refilled today for  30-day supply - pt to see me in one month for f/u appt.                * * *                ---          * * *          Patient: Mom, Shekelia DOB: Jun 04, 1996 Provider: Genia Plants, MD  03/08/2016    ---    Note generated by eClinicalWorks EMR/PM Software ([email or web address])

## 2020-12-20 NOTE — Progress Notes (Signed)
* * *        **[name], [name]**    --- ---    37 Y old Female, DOB: 03-11-1996, External MRN: [id number]    Account Number: [id number]    [address] [place], [id number]    Home: [phone]    Insurance: [employer] Gunning PPO    PCP: Zonia Kief, MD Referring: Tharon Aquas    Appointment Facility: [facility]        * * *    03/08/2016  Progress Note: [name] [name] **CHN#:** [id number]    --- ---    ---        Current Medications    ---    Taking     * Econazole Nitrate 1 % Cream 1 application to affected area Externally Once a day    ---    Not-Taking/PRN    * Clonazepam 0.5 MG Tablet 1 tablet Orally Once a day    ---    * Sertraline HCl 25 mg Tablet 1 tablet Orally Once a day    ---    * Medication List reviewed and reconciled with the patient    ---      Past Medical History    ---       Celiac disease - adherent to gluten free diet.        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        ---    Left shoulder labral tear.        ---    Left knee injury - medial and lateral menisus tears, ACL tear.        ---    Chondromalacia patellae.        ---    Right ankle sprain.        ---        Reason for Appointment    ---      1\. ANXIETY    ---    2\. Anxiety doesnt seem any better. not using clonazepam or sertaline.    ---      Assessments    ---    1\. 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 Eczematous dermatitis**    Refill Econazole Nitrate Cream, 1 %, 1 application to affected area,  Externally, Once a day, 30 day(s), 1 tube, Refills 3    ---         **2\. Anxiety disorder due to general medical condition with panic attack**    Refill Clonazepam Tablet, 0.5 MG, 1 tablet, Orally, Once a day, 30 days, 30,  Refills 0    Refill Sertraline HCl Tablet, 25 mg, 1 tablet, Orally, Once a day, 30 days,  30, Refills 0      Follow Up     ---    4 Weeks, DR. Alexsia Klindt (Reason: ANNUAL PHYSICAL WITH DR. Octaviano Glow IN 4 WEEKS)      History of Present Illness    ---     _GENERAL_ :    Pt is here to follow up with regard to eczematous dermatitis and increased  anxiety episodes. She mentions that she has been taking econazole ointment for  dermatitis which has been helping with no  worsening flares.    She reports she has been having anxiety episodes once a week. She reports her  symptoms include chest pressure and shakes and heart racing and difficulty  with breathing whenever she gets anxiety attacks. Her bloodwork was checked  with normal bloodwork including thyroid function testing and CBC. She mentions  while she was taking the medication her symptoms were the same. She reports  she was taking sertraline every day along with clonazepam every day. While she  was taking medication, she had only 1-2 attacks. She mentions the only reason  she stopped taking medication was because she was not coming in and did not  have appointment until now. She does report seeing a counselor/therapist at  [facility] in [place] but has not been there in 2-3 weeks so she knows she  will need to follow-up with them. She says she had last panic episode this  morning and cannot attribute it to anything in particular (not really to  stress from school or from family or friends). No other concerns reported to  me.      Vital Signs    ---    Pain scale 0, Ht-in 66.34, Wt-lbs 171, BMI 27.31, BP 110/80, HR 96, Ht-cm  168.5, Wt-kg 77.57, Wt Change 6 lb, Temp 98.1, Oxygen sat % 99.    Electronically signed by Genia Plants MD on 10/19/2016 at 04:15 PM EST    Sign off status: Completed        * * *        [facility]    [place], [place] [address]    Tel: [phone]    Fax: [phone]              * * *          Patient: [name] [name], [name] [name] DOB: 09/27/1995 Progress Note: [facility]  03/08/2016    ---    Note generated by eClinicalWorks EMR/PM Software  ([email or web address])

## 2020-12-20 NOTE — Progress Notes (Signed)
* * *        **  Balsley, Arryana**    --- ---    33 Y old Female, DOB: 1995/12/28    [address] [place], [place], [place] [address]    Home: [phone]    Provider: Tharon Aquas, MD        * * *    Telephone Encounter    ---    Answered by   Albesa Seen  Date: 02/29/2016         Time: 12:12 PM    Message                      CALLED AND LEFT MESSAGE FOR PT TO RESCHEDULE APPT THAT WAS CANCELLED ON 03/20/16 DUE TO DR. Franchot Heidelberg BEING ON VACATION.        --- ---            Action Taken   [facility] 03/07/2016 11:57:37 AM > LVM Baxter,Leanne  03/09/2016 10:46:47 AM > Rescheduled with Dr. Octaviano Glow                * * *                ---          * * *          Patient: SHAGUANA, LOVE DOB: [birth year] Provider: Tharon Aquas, MD  02/29/2016    ---    Note generated by eClinicalWorks EMR/PM Software ([email or web address])

## 2020-12-21 NOTE — Progress Notes (Signed)
* * *        **[name], [name]**    --- ---    65 Y old Female, DOB: July 08, 1996, External MRN: [id number]    Account Number: [id number]    [address], [id number]    Home: [phone]    Insurance: [employer] Gunning PPO Payer ID: PAPER    PCP: Jonathon Bellows Referring: Jonathon Bellows External Visit ID: [id number]    Appointment Facility: Infectious Disease        * * *    08/19/2018  [facility]: Karle Starch, MD **CHN#:** [phone]    --- ---    ---         **Current Medications**    ---    Taking     * Azithromycin 500 MG Tablet 1 tablet Orally once a day for up to 3 days if diarrhea (may repeat if needed)    ---    * Loperamide A-D 2 MG Tablet 1 tablet Orally after each loose bowel movement not to exceed 8 tablets a day    ---    * Malarone 250-100 MG Tablet as directed Orally every day starting 1 day prior to departure, every day while in country, and for 7 days upon return    ---    * Medication List reviewed and reconciled with the patient    ---      Past Medical History    ---       Celiac disease - adherent to gluten free diet.        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        ---    Left shoulder labral tear.        ---    Left knee injury - medial and lateral menisus tears, ACL tear.        ---    Chondromalacia patellae.        ---    Right ankle sprain.        ---       **Surgical History**    ---       Left ACL tear, lateral and medial meniscus injury repair. Feb 2013    ---    Left shoulder labral repair 05/14    ---    Left medial meniscus repair 06/15    ---       **Family History**    ---       Mother: alive, diagnosed with Hypertension    ---    Father: alive    ---    Maternal Grand Mother: Depression    ---    Maternal Grand Father: deceased, Diabetes, Hypertension    ---    Siblings: Sister - depression, anxiety Twin brothers, healthy    ---    Non-Contributory    ---    No known hx of autoimmune diseases.    Denies early CAD, CVA,  thyroid d/o, substance abuse    Denies h/o breast, colon, ovarian, skin cancers    Denies CRC, thyroid disease, or celiac disease.    ---       **Social History**    ---    Tobacco Does patient currently smoke? -.   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    ---       **Allergies**    ---       N.K.D.A.    ---    Forrestine Him Verified]       **Hospitalization/Major Diagnostic Procedure**    ---       No Hospitalization History.    ---       **Review of Systems**    ---     _Infectious Disease_ :    Constitutional: no fever, no abnormal weight change, no night sweats, no  chills. HEENT: no diplopia, no darkened/blurred vision, no congestion, no sore  throat, no ear ache, no oral ulcers, no stiff neck. Cardiovascular: no chest  pain, no palpitations, no lightheadedness. Pulmonary: no cough, no shortness  of breath, no hemoptysis. GI: no abdominal pain, no diarrhea, no constipation,  no vomiting. GU: no polyuria, no nocturia, no hematuria, no dysuria. Skin: no  rashes, no lesions , no edema. Neurology: no headaches, no syncope, no  tremors. Psychology: no mood changes, no sleep disturbances. Musculoskeletal:  no weakness, no stumbling gait, no joint pain. Hematology: no easy bruising,  no abnormal bleeding. Lymphatic: no palpable lymph nodes. Endocrine: no  nocturia, no excessive thirst, no chronic fatigue, no sensitivity to heat or  cold.            **Reason for Appointment**    ---       1\. Seeking advice and vaccinations prior to traveling to [place]    ---       **History of Present Illness**    ---     _Travel_ :    Reason for Travel: -, [facility]. Travel:  ** **   Country   Cities    Arrival Date   Length of Stay   Acccomodations    --- --- --- --- ---    [place]   [facility] [place]   09/22/18   14 days   Hotel                                                                                                                                                 . Vaccinations:  Vaccine   Yes   No   Date   Reaction   Documented Immunity    --- --- --- --- --- ---    Polio   x      1996         Tetanus/Diptheria/Pertussis   x2      2011         Measles/Mumps/Rubella   x2      2001         Hepatitis A   x      2018         Hepatitis B   x3  1996         Typhoid (oral)                  Typhoid (injection)   x      2018         Yellow Fever                  Japanese Encephalitis                  Rabies                  Meningitis (Menactra 2-4yrs)   x      2007         Meningitis (Menomune (2-70yrs, >55)                  Influenza                  Varicella   x      1997         Other                                                    . Pregnancy status: Not Pregnant, Last normal menses. Immune Dysfunction (e.g.  chemotherapy, steroids, HIV, etc.): -, no. Hx of depression, anxiety, or other  psychiatric disorders: None. Hx of Varicella, Measles, Mumps, Rubella: none.      **Examination**    ---     _ID: Travel Discussion_ :    Discussed the following items: Special medical problems DVT prevention  Swimming (schistosomiasis) Sun screen Blood borne pathogens/ STDs Viral  hepatitis Travelers diarrhea (prevention/treatment) Post-travel follow-up  services Bug bite prevention Dengue prevention Malaria prevention Road safety  Altitude sickness Rabies Traveler's Insurance Avian Influenza Motion sickness  Other:.    Medications suggested/offered, but not given :.    Reason medications/vaccinations not given: -.          **Assessments**    ---    1\. Travel advice encounter - Z71.84 (Primary)    ---    2\. Encounter for immunization - Z23    ---      I spent 30 minutes with the patient, and of that approximately 30 minutes  were spent counseling.    ---       **Treatment**    ---       **1\. Travel advice encounter**    Start Azithromycin Tablet, 500 MG, 1 tablet, Orally, once a day for up  to 3  days if diarrhea (may repeat if needed), 3 days, 6, Refills 0    Start Malarone Tablet, 250-100 MG, as directed, Orally, every day starting 1  day prior to departure, every day while in country, and for 7 days upon  return, 23 days, 25, Refills 0    Start Loperamide HCl Capsule, 2 MG, 1 capsule, Orally, Four times a day as  needed, not to exceed 8 mg per day, 3 days, 30, Refills 0    Notes: 1) Hep A: this is her 2nd Hep A    2) Td received in 2011.    ---      **Immunization**    ---       Hepatitis A : 1.0 (Dose No:2) (Route: Intramuscular) given by Orbie Pyo on Left Deltoid    ---  Influenza : 0.5 (Route: Intramuscular) given by Orbie Pyo on Left  Deltoid    ---       **Procedure Codes**    ---       [id number] Hepatitis A, Units: 2.00    ---    60454 Influenza    ---       **Follow Up**    ---    prn    Electronically signed by Karle Starch , MD on 08/19/2018 at 12:12 PM EST    Sign off status: Completed        * * *        [facility]    [address] [place]    [place], [place] [address]    Tel: [phone]    Fax: [phone]              * * *          Patient: [name], [name] DOB: 11-25-1995 Progress Note: Karle Starch, MD  08/19/2018    ---    Note generated by eClinicalWorks EMR/PM Software ([email or web address])

## 2020-12-21 NOTE — Progress Notes (Signed)
* * *        **  [name], [name]**    --- ---    27 Y old Female, DOB: November 26, 1995    [address] [place], [place], [place] [address]    Home: [phone]    Provider: Genia Plants, MD        * * *    Telephone Encounter    ---    Answered by   Genia Plants  Date: 03/08/2016         Time: 03:26 PM    Reason   MASSPAT    --- ---            Action Taken   Genia Plants , MD 03/08/2016 3:26:54 PM > Jill Side, just need  MASSPAT for patient. Thank you. Griffin,Colleen 03/08/2016 3:35:16 PM > scanned  into your docs. Javionna Leder , MD 03/08/2016 3:58:48 PM > thank you.  MASSPAT/PMP reviewed. Pt is due for clonazepam refill. Refilled today for  30-day supply - pt to see me in one month for f/u appt.                * * *                ---          * * *          Patient: [name], [name] DOB: Jun 04, 1996 Provider: Genia Plants, MD  03/08/2016    ---    Note generated by eClinicalWorks EMR/PM Software ([email or web address])

## 2020-12-21 NOTE — Progress Notes (Signed)
* * *        **Streng, Yashira**    --- ---    37 Y old Female, DOB: 03-11-1996, External MRN: [id number]    Account Number: [id number]    [address], [id number]    Home: [phone]    Insurance: [employer] PPO    PCP: Zonia Kief, MD Referring: Tharon Aquas    Appointment Facility: [facility]        * * *    03/08/2016  Progress Note: Ines Bloomer Natonya Finstad **CHN#:** [id number]    --- ---    ---        Current Medications    ---    Taking     * Econazole Nitrate 1 % Cream 1 application to affected area Externally Once a day    ---    Not-Taking/PRN    * Clonazepam 0.5 MG Tablet 1 tablet Orally Once a day    ---    * Sertraline HCl 25 mg Tablet 1 tablet Orally Once a day    ---    * Medication List reviewed and reconciled with the patient    ---      Past Medical History    ---       Celiac disease - adherent to gluten free diet.        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        ---    Left shoulder labral tear.        ---    Left knee injury - medial and lateral menisus tears, ACL tear.        ---    Chondromalacia patellae.        ---    Right ankle sprain.        ---        Reason for Appointment    ---      1\. ANXIETY    ---    2\. Anxiety doesnt seem any better. not using clonazepam or sertaline.    ---      Assessments    ---    1\. 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 Eczematous dermatitis**    Refill Econazole Nitrate Cream, 1 %, 1 application to affected area,  Externally, Once a day, 30 day(s), 1 tube, Refills 3    ---         **2\. Anxiety disorder due to general medical condition with panic attack**    Refill Clonazepam Tablet, 0.5 MG, 1 tablet, Orally, Once a day, 30 days, 30,  Refills 0    Refill Sertraline HCl Tablet, 25 mg, 1 tablet, Orally, Once a day, 30 days,  30, Refills 0      Follow Up     ---    4 Weeks, DR. Tamiya Colello (Reason: ANNUAL PHYSICAL WITH DR. Octaviano Glow IN 4 WEEKS)      History of Present Illness    ---     _GENERAL_ :    Pt is here to follow up with regard to eczematous dermatitis and increased  anxiety episodes. She mentions that she has been taking econazole ointment for  dermatitis which has been helping with no  worsening flares.    She reports she has been having anxiety episodes once a week. She reports her  symptoms include chest pressure and shakes and heart racing and difficulty  with breathing whenever she gets anxiety attacks. Her bloodwork was checked  with normal bloodwork including thyroid function testing and CBC. She mentions  while she was taking the medication her symptoms were the same. She reports  she was taking sertraline every day along with clonazepam every day. While she  was taking medication, she had only 1-2 attacks. She mentions the only reason  she stopped taking medication was because she was not coming in and did not  have appointment until now. She does report seeing a counselor/therapist at  [facility] in [place] but has not been there in 2-3 weeks so she knows she  will need to follow-up with them. She says she had last panic episode this  morning and cannot attribute it to anything in particular (not really to  stress from school or from family or friends). No other concerns reported to  me.      Vital Signs    ---    Pain scale 0, Ht-in 66.34, Wt-lbs 171, BMI 27.31, BP 110/80, HR 96, Ht-cm  168.5, Wt-kg 77.57, Wt Change 6 lb, Temp 98.1, Oxygen sat % 99.    Electronically signed by Genia Plants MD on 10/19/2016 at 04:15 PM EST    Sign off status: Completed        * * *        [facility]    [place], [place] [address]    Tel: [phone]    Fax: [phone]              * * *          Patient: JURNI, CESARO DOB: 09/27/1995 Progress Note: [facility]  03/08/2016    ---    Note generated by eClinicalWorks EMR/PM Software  ([email or web address])

## 2020-12-21 NOTE — Progress Notes (Signed)
* * *        **  [name], [name]**    --- ---    64 Y old Female, DOB: [birth year]    [address] [place], [place] [address]    Home: [phone]    Provider: Zonia Kief, MD        * * *    Web Encounter    ---    Answered by   Dorothy Spark  Date: 02/05/2016         Time: 08:07 PM    Caller   Littie Ruud    --- ---            Reason   New Appointment Request            Message                      Appointment Type: Follow up        Facility: [facility]      Provider: Zonia Kief       Date Range  From: 02/14/2016    To: 02/18/2016      Day         First Preference: Any day as long as time request is accepted    Second Preference:      Time        First Preference: Morning (8-12AM)    Second Preference:      Preferred Method of Contact: eMail      Email: [email or web address]      Contact Number: [phone]      Reason Of Visit: anxiety                       Action Taken   [facility] 02/07/2016 10:33:06 AM > replied to pt on  portal                * * *         **eMessages**   From:   Harwood,Margaret    --- ---    Created:   2016-02-07 10:32:43    Sent:      Subject:   RE:New Appointment Request    Message:      [name],    We received your portal message that you wanted to schedule an appointment  with Dr Fidela Juneau. We booked you an appointment for 02/14/16 at 11:40AM with Dr Fidela Juneau.  If this appointment time or date does not work, please give our office a call.  Our telephone number is [phone].      Thank you,    [facility] in [place]                        ---          * * *          Patient: [name] [name], [name] [name] DOB: [birth year] Provider: Zonia Kief, MD  02/05/2016    ---    Note generated by eClinicalWorks EMR/PM Software ([email or web address])

## 2020-12-21 NOTE — Progress Notes (Signed)
* * *        **Enoch, Charlaine**    --- ---    11 Y old Female, DOB: 29-Dec-1995, External MRN: [id number]    Account Number: [id number]    [address], [id number]    Home: [phone]    Insurance: Radene Gunning PPO    PCP: Zonia Kief, MD Referring: France Ravens    Appointment Facility: [facility] Clinic        * * *    07/31/2017   **Appointment Provider:** Leticia Clas, MD **CHN#:** [phone]    --- ---      **Supervising Provider:** Lucille Passy, MD    ---        Reason for Appointment    ---      1\. CELIAC    ---      History of Present Illness    ---     _GENERAL_ :    [name] is a 25 year old female with a PMHx of Celiac Disease (dx in  2014 via antibodies and endoscopy) who presents to the Gastroenterology Clinic  at [facility] for new onset of symptoms. She reports at the time of  diagnosis she adhered to a strict gluten free diet. She has not had break  though symptoms. About 4-5 months ago she has started to have loose stools  with increased urgency. She has 4-6 bowel movements a day including night time  wakening's denies blood. She reports increased bloating as well. She denies  any skin rashes or difficulties breathing. Denies any weight changes. She does  not believe she has accidentally consumed gluten. She denies any nausea,  vomiting, epigastric pain or change in medications.      Current Medications    ---      None    ---      Past Medical History    ---       Celiac disease - adherent to gluten free diet.        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        ---    Left shoulder labral tear.        ---    Left knee injury - medial and lateral menisus tears, ACL tear.        ---    Chondromalacia patellae.        ---    Right ankle sprain.        ---      Surgical History    ---      Left ACL tear, lateral and medial meniscus injury repair. Feb 2013    ---    Left shoulder labral repair 05/14    ---    Left medial  meniscus repair 06/15    ---      Family History    ---      Mother: alive, diagnosed with Hypertension    ---    Father: alive    ---    Maternal Grand Mother: Depression    ---    Maternal Grand Father: deceased, diagnosed with Diabetes, Hypertension    ---    Siblings: Sister - depression, anxiety Twin brothers, healthy    ---    No known hx of autoimmune diseases.    Denies early CAD, CVA, thyroid d/o, substance abuse    Denies h/o breast, colon, ovarian, skin cancers  Denies CRC, thyroid disease, or celiac disease.    ---      Social History    ---    Tobacco    history: _Never smoked_    Military History    Have you or anyone in your family served in the [employer] in any capacity?  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    Exercise: Was competing in [employer], exercising daily.    Alcohol    _Socially 1 glass, once/month_    Illicit drugs: Denies.    Seat Belts: always.    Lives with: Parents.   Feels safe in her home    Denies guns in the home    Gymnastics and rowing while in high school    Denies tobacco use, occational etoh, or illicits    In [employer] major in bio and chem.    ---      Allergies    ---      N.K.D.A.    ---      Hospitalization/Major Diagnostic Procedure    ---      Denies Past Hospitalization    ---      Review of Systems    ---     _GI/Hepatology_ :    Constitutional No recent weight change, fever, or fatigue. Gastrointestinal  See HPI. Cardiovascular No heart disease, chest pain, angina, palpitations,  shortness of breath, swelling of feet, ankles, or hands. Neurological No  frequent headaches, lightheaded or dizziness. Respiratory No chronic  cough/phlegm production, spitting up blood, shortness of breath, asthma or  wheezing. Endocrine No glandular or hormone problems, or thyroid disease,.  Hematologic/Lymphatic Not  slow to heal after cuts. No bleeding or bruising  easily. Integumentary (Skin, Breast) No rash or itching, change in color of  skin,. Genitourinary No frequent urination, burning or painful urination, or  blood in urine, . Musculoskeletal No joint pain, muscle aches, or joint  swelling.          Vital Signs    ---    Ht-in 66.34, Wt-lbs 168, BMI 26.84, BP 120/78, HR 88, BSA 1.89, O2 98%RA, Ht-  cm 168.5, Wt-kg 76.2, Wt Change -3 lb.      Physical Examination    ---     _GENERAL_ :    General Appearance: no acute distress, alert and oriented.    Mood/Affect: appropriate.    HEENT Normocephalic, atraumatic. Sclera clear, conjunctiva pink.    Neck supple.    Skin There is no rash or other skin lesions noted.    Cardiovascular RRR, no murmurs. There was no carotid bruit.    Lungs Clear to auscultation; no wheezes, rhonchi or rales.    Abdomen Soft, nontender, nondistended, no organmegaly. Normoactive bowel  sounds.    CVA tenderness no.    Extremities There was no clubbing, cyanosis or edema noted.    Psych appropriate mood and affect.          Assessments    ---    1\. Celiac disease - K90.0 (Primary)    ---    2\. Diarrhea, unspecified type - R19.7    ---    3\. Lower abdominal pain - R10.30    ---    4\. 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 is a 25 year old female with a PMHx of Celiac Disease (dx in  2014 via antibodies and endoscopy) who presents to the Gastroenterology Clinic  at [facility] for new onset of symptoms. Her symptoms appear  consistent with a flair of Celiac. We will obtain antibody levels to test.  Other diagnosis are considered and remain on the differential including IBD,  SIBO, hyper/hypothyroidism, IBS-D and microscopic colitis. We will start with  celiac markers and SIBO testing. If these are negative she will likely need a  colonoscopy to further assess. We will see her in clinic in 3 months for  follow up.    ---      Treatment    ---       **1\.  Celiac disease**    _LAB: Thyroid Stimulating Hormone (TSH)_   Thyroid Stimulating Hormone (TSH)   0.71    0.35 - 4.94 - uIU/mL    --- --- --- ---    _LAB: Tissue Transglutam IgA_ Tissue Transglutaminase IgA  4.9    0.0 - 20.0 -  units    --- --- --- ---    TTG IgA Interpretation  Negative    Negative -    --- --- --- ---    _LAB: C-Reactive Protein, High sens (CRPHS)_    _LAB: CBC/DIFF with PLT (CBCWD)_    Notes: 1. Please perform the breath test at home, if not covered by your  insurance please call to discuss    2\. labs today    3\. Schedule appointment with nutrition    4\. Follow up in clinic in 3 months.      Follow Up    ---    3 Months    **Appointment Provider:** Leticia Clas, MD    Electronically signed by Paulla Dolly MD, MD on 08/06/2017 at 11:16 AM EST    Sign off status: Completed        * * *        GI Clinic    [address]    [place], [place] [address]    Tel: [phone]    Fax: [phone]              * * *          Patient: RHETT, NAJERA DOB: 1996/08/27 Progress Note: Leticia Clas,  MD 07/31/2017    ---    Note generated by eClinicalWorks EMR/PM Software ([email or web address])

## 2020-12-21 NOTE — Progress Notes (Signed)
* * *        **  [name], [name]**    --- ---    47 Y old Female, DOB: 14-Apr-1996    [address], [place], [place] [address]    Home: [phone]    Provider: Genia Plants, MD        * * *    Web Encounter    ---    Answered by   Dorothy Spark  Date: 04/11/2016         Time: 01:33 PM    Caller   Alika Wachtel    --- ---            Reason   Reschedule Appointment Request            Message                      Rescheduled From:      Date: 04/11/2016  Time: 2:20 PM Reason: NP PREV DR NEY PHYSICAL      Provider: Genia Plants Facility:[facility]       ------------------------------------------       Rescheduled To:      Appointment Type: Consult            Facility: [facility]      Provider: Genia Plants       Date Range  From: 04/23/2016    To: 04/28/2016      Day         First Preference: Tuesday    Second Preference:Thursday      Time        First Preference: Morning (8-12AM)    Second Preference:Morning (8-12AM)      Preferred Method of Contact: eMail      Email: [email or web address]      Contact Number: [phone]      Reason Of Visit: NP PREV DR NEY PHYSICAL       Reason For Reschedule:                 Action Taken   [facility] 04/12/2016 9:42:10 AM > replied on portal                * * *         **eMessages**   From:   Willow Ora    --- ---    Created:   2016-04-12 09:42:07    Sent:      Subject:   [id number] Appointment Request    Message:      [name],    Dr Conley Simmonds' next available new patient appointment is 8/14. Please give our  office a call so we can help you schedule this appointment. Our telephone  number is [phone].    Thank you,    [facility] in [place]                        ---          * * *          Patient: [name] [name], [name] [name] DOB: 03/05/1996 Provider: Genia Plants, MD  04/11/2016    ---    Note generated by eClinicalWorks EMR/PM Software ([email or web address])

## 2020-12-21 NOTE — Progress Notes (Signed)
* * *        **[place], [place]**    --- ---    5 Y old Female, DOB: [birth year], External MRN: [id number]    Account Number: [id number]    [address], [id number]    Home: [phone]    Insurance: Radene Gunning PPO    PCP: Zonia Kief, MD Referring: Larena Sox    Appointment Facility: [facility]        * * *    08/23/2017  Progress Notes: Consuella Lose, MS, RD, LDN **CHN#:** [phone]    --- ---    ---        History of Present Illness    ---     _GENERAL_ :    [name] [name] is a 25 yo female referred to nutrition for ?irritiable bowel  syndrome with diarrhea; she presented to GI clinic with months of diarrhea and  abdominal bloating discomfort; initially thought to be ?celiac flare, however  TTG IGa levels foudn to be WNL. She follows a strict gluten free diet and  notes no changes in her diet since the onset of her GI symptoms. [name] [name] is currently in school- she commutes daily- lives at home with her  father; her brother, sister and law and nephew just moved into the house as  well. 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 At home, she has her own section of the frdge and her own  containers; she uses parchment or tinfoil on shared cookware; she does not  have her own toaster over or cuttingboards- reports she rarely uses the  [employer]. She is not taking vitamin/mineral or herbal supplements at this  time. Diet is otherwise unrestricted. Live home commute to school-  brother/wife/ son in house.     _Typical Intake_ :    B: Egg with spinach, gluten free toast or bagel    OR gluten free oatmeal with berries or banana    OR gluten free bagel or toast with peanut butter    . L: chicken, meat, vegetables, rice . D: gluten free pasta, chicken,  vegetables    .    Vegetable preferences: zuchinni and summer squash, broccoli, peppers ; does  not cook with garlic and onions; eat sweet potatoes rarely/on occassion; eats  fruits  more often in the summer- occasionally has banana, berries and apples    Beverages: water, tea on occassion.      Past Medical History    ---       Celiac disease - adherent to gluten free diet.        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        ---    Left shoulder labral tear.        ---    Left knee injury - medial and lateral menisus tears, ACL tear.        ---    Chondromalacia patellae.        ---    Right ankle sprain.        ---      Vital Signs    ---    Ht-in 66.34, Wt-lbs 166, BMI 26.52, BP 118/77, HR 88, BSA 1.88, O2 99%RA, Wt-  kg 75.3    When I compete I am at 158-161#.      Assessments    ---  1\. 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 Celiac disease - K90.0, Not currently adherent with diet. Check labs    ---      Altered GI function related to diarrhea, abdominal pain and hx of celiac  disease as evidneced by IBS with diarrhea.    Estimated Needs:    MSJ (1530) x 1.5= 2295 kcal/day    1.2g/kg pro= 90g/day.    ---      Treatment    ---       **1\. Irritable bowel syndrome with diarrhea**    Notes: [name] [name] is a 25 yo female with hx of celiac disease who is seen  by me for assessment of ?dietary causes of new onset diarrhea/abdominal pain  (?IBS-D). Based on a thorough diet recall and discuss, it does not seem that  her diet as changed aroudn the time of the onset of her symptoms; dietary  assessment reveals excellent compliance with gluten free diet and very minimal  high FODMAP foods in patient's diet. We did go over the high FODMAP food list  and patient reports minimal intake of any high FODMAP foods. I suggested she  keep a food and GI symptom log x 1 week to send to me for more indepth review.  I did provide her with low FODMAP materials , however, her diet is already  quite low FODMAP to begin with. I reinforced important areas where cross  contamination with gluten may occur in a household that is not gluten  free-  urged her to get her own toaster overn and cutting board.    she will contact me with additional questions and will mail me her food log  for review at her convenience.    time spent: 60 minutes individual counseling & coordination of care    MONITOR: GI symptoms, food and and GI symptom log, celiac serologies.    ---    Electronically signed by Consuella Lose , MS RD LDN on 08/28/2017 at 04:06 PM  EST    Sign off status: Completed        * * *        [facility]    [address]    [place], [place] [address]    Tel: [phone]    Fax: [phone]              * * *          Patient: [name] [name], [name] [name] DOB: 1995/11/30 Progress Note: Consuella Lose,  MS, RD, LDN 08/23/2017    ---    Note generated by eClinicalWorks EMR/PM Software ([email or web address])

## 2020-12-21 NOTE — Progress Notes (Signed)
* * *        **[name], [name]**    --- ---    18 Y old Female, DOB: [birth year], External MRN: [id number]    Account Number: [id number]    [address], [id number]    Home: [phone]    Insurance: Radene Gunning PPO Payer ID: PAPER    PCP: Zonia Kief, MD Referring: France Ravens External Visit ID: [id number]    Appointment Facility: Infectious Disease        * * *    08/30/2017  Travel Clinic: Candelaria Celeste, MD **CHN#:** [phone]    --- ---    ---        Current Medications    ---      None    ---      Past Medical History    ---       Celiac disease - adherent to gluten free diet.        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        ---    Left shoulder labral tear.        ---    Left knee injury - medial and lateral menisus tears, ACL tear.        ---    Chondromalacia patellae.        ---    Right ankle sprain.        ---      Surgical History    ---      Left ACL tear, lateral and medial meniscus injury repair. Feb 2013    ---    Left shoulder labral repair 05/14    ---    Left medial meniscus repair 06/15    ---      Social History    ---    Tobacco    Does patient currently smoke? _-_       Allergies    ---      N.K.D.A.    ---    Forrestine Him Verified]        Reason for Appointment    ---      1\. NP/[place] IMNZ REC VFADVZ    ---      History of Present Illness    ---     _Travel_ :    Reason for Travel: -, Adventure, Install water filters.    Travel:  ** **   Country   Cities   Arrival Date   Length of Stay    Acccomodations    --- --- --- --- ---    [place]   [place]   09/23/2017   14 days   Hotel                                                                                                                                                .  Vaccinations:  Vaccine   Yes   No   Date   Reaction   Documented Immunity    --- --- --- --- --- ---    Polio   x      12/15/1999         Tetanus/Diptheria/Pertussis   x      01/11/2005 05/20/2010          Measles/Mumps/Rubella   x      03/05/1996 12/15/1999         Hepatitis A      x            Hepatitis B   x      05-08-1995 4/17/199612/07/1995         Typhoid (oral)      x            Typhoid (injection)      x            Yellow Fever      x            Japanese Encephalitis      x            Rabies      x            Meningitis (Menactra 2-51yrs)   x      01/12/2006         Meningitis (Menomune (2-76yrs, >55)                  Influenza      x            Varicella   x      03/05/1996      had disease 2003    Other                                                    .    Immune Dysfunction (e.g. chemotherapy, steroids, HIV, etc.): -, no.    Hx of depression, anxiety, or other psychiatric disorders: None.    Hx of Varicella, Measles, Mumps, Rubella: Chickenpox in childhood.      Examination    ---     _ID: Travel Discussion_ :    Discussed the following items: Special medical problems DVT prevention  Swimming (schistosomiasis) Sun screen Blood borne pathogens/ STDs Viral  hepatitis Travelers diarrhea (prevention/treatment) Post-travel follow-up  services Bug bite prevention Dengue prevention Malaria prevention Road safety  Altitude sickness Rabies Traveler's Insurance Avian Influenza Motion sickness  Other: Ship broker provided to patient..          Assessments    ---    1\. 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 In her free time she will hike, see temples,  and go to a bird sanctuary. No active psych diagnoses or medical problems. Pt  will receive typhoid, flu, and hepatitis A vaccines today. E-Rxs for  azithromycin, loperamide, and malarone provided. although sam riep is thought  to be low risk for malaria, due to the nature of her trip and activities it  will involve, we did recommend malaria ppx.    I spent 30 minutes with  the patient, and of that approximately 30 minutes  were  spent counseling.    ---      Treatment    ---       **1\. Travel advice encounter**    Start Loperamide A-D Tablet, 2 MG, 1 tablet, Orally, after each loose bowel  movement not to exceed 8 tablets a day, 12, Refills 0    Start Azithromycin Tablet, 500 MG, 1 tablet, Orally, once a day for up to 3  days if diarrhea (may repeat if needed), 3, Refills 0    Start Malarone Tablet, 250-100 MG, as directed, Orally, every day starting 1  day prior to departure, every day while in country, and for 7 days upon  return, 23, Refills 0    ---      Immunization    ---      Hepatitis A : 1.0 (Dose No:1) (Route: Intramuscular) given by Orbie Pyo on Left Deltoid    ---    Typhoid (Injectible) : 0.5 cc (Route: Intramuscular) given by Orbie Pyo on Right Deltoid    ---    Influenza : 0.5 (Route: Intramuscular) given by Orbie Pyo on Left  Deltoid    ---      Procedure Codes    ---      13086 Hepatitis A    ---    57846 Typhoid (Injectible)    ---    [phone] Administration of Vaccine, First Vaccine    ---    [phone] Influenza    ---      Follow Up    ---    6 mos hep A #2    Electronically signed by Candelaria Celeste , MD on 09/06/2017 at 01:10 PM EST    Sign off status: Completed        * * *        Infectious Disease    [address]    [place], [place] [address]    Tel: [phone]    Fax: [phone]              * * *          Patient: [name] [name], [name] [name] DOB: 07/28/1996 Progress Note: Candelaria Celeste,  MD 08/30/2017    ---    Note generated by eClinicalWorks EMR/PM Software ([email or web address])

## 2020-12-21 NOTE — Progress Notes (Signed)
* * *        **[name] [name]**    --- ---    54 Y old Female, DOB: [birth year], External MRN: [id number]    Account Number: [id number]    [address], [id number]    Home: [phone]    Insurance: Radene Gunning PPO Payer ID: PAPER    PCP: Zonia Kief, MD Referring: Naida Sleight External Visit ID: [id number]    Appointment Facility: Podiatry Clinic        * * *    01/31/2016  Progress Notes: Victorio Palm, DPM **CHN#:** [id number]    --- ---    ---        Current Medications    ---    Taking     * Clonazepam 0.5 MG Tablet 1 tablet Once a day    ---    * Sertraline HCl 25 mg Tablet 1 tablet Once a day    ---    * Medication List reviewed and reconciled with the patient    ---      Past Medical History    ---       Celiac disease - adherent to gluten free diet.        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        ---    Left shoulder labral tear.        ---    Left knee injury - medial and lateral menisus tears, ACL tear.        ---    Chondromalacia patellae.        ---    Right ankle sprain.        ---      Surgical History    ---      Left ACL tear, lateral and medial meniscus injury repair. Feb 2013    ---    Left shoulder labral repair 05/14    ---    Left medial meniscus repair 06/15    ---      Family History    ---      Mother: alive, diagnosed with Hypertension    ---    Father: alive    ---    Maternal Grand Mother: Depression    ---    Maternal Grand Father: deceased, diagnosed with Hypertension, Diabetes    ---    Siblings: Sister - depression, anxiety Twin brothers, healthy    ---    No known hx of autoimmune diseases.    Denies early CAD, CVA, thyroid d/o, substance abuse    Denies h/o breast, colon, ovarian, skin cancers.    ---      Social History    ---    Tobacco history: Never smoked.    Military History Have you or anyone in your family served in the [employer] in  any capacity? Yes, If YES, describe who sibling.    Depression Screening  Depression Screening Findings Negative.    Sexual history  Never sexually active.    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    Exercise: Was competing in [employer], exercising daily.    Alcohol  Socially 1 glass, once/month.    Illicit drugs: Denies.    Seat Belts: always.    Lives with: Parents.   Feels safe in her home    Denies guns in the home    Gymnastics and rowing while in high school.    ---  Allergies    ---      N.K.D.A.    ---    Forrestine Him Verified]      Hospitalization/Major Diagnostic Procedure    ---      No Hospitalization History.    ---        Reason for Appointment    ---      1\. She relates that the hallux nail of her left great toe has fallen off  twice and keeps regrowing. she denies any injury to the toe    ---      Vital Signs    ---    Pain scale 1, Ht-in 66.34, Wt-lbs 165, BMI 26.36, BSA 1.87, Ht-cm 168.5, Wt-kg  74.84, Wt Change -3 lb.      Examination    ---     _GENERAL_ :    palpable pedal pulses and neurologic sensation intact. skin is intact with no  fissures, ulcerations, or abscesses. there is a dry, scaly eruption of the  left foot, symptoms consistent with tinea pedis. there is subungal debris and  mild thickening of the left hallux nail. there is no incurvation of the nail  at this date.          Assessments    ---    1\. 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 Tinea pedis of left foot - B35.3    ---    3\. Foot pain, left - Z61.096    ---      Treatment    ---       **1\. Dermatophytosis of nail**    Start Econazole Nitrate Cream, 1 %, 1 application to affected area,  Externally, Once a day    Notes: inspected feet and provided patient with a prescription for econazole  cream. discussed with patient that her nail is likely falling off because of  the fungus under her nails. instructed patient to apply econazole around the  nails to treat fungus. instructed patient on proper home care.    ---      Follow Up    ---    2 Months    Electronically signed  by Victorio Palm , DPM on 02/03/2016 at 07:09 AM EDT    Sign off status: Completed        * * [facility]    [address]    [place], [place] [address]    Tel: [phone]    Fax: [phone]              * * *          Patient: [name] [name], [name] [name] DOB: 1995-10-16 Progress Note: Victorio Palm,  DPM 01/31/2016    ---    Note generated by eClinicalWorks EMR/PM Software ([email or web address])

## 2020-12-21 NOTE — Progress Notes (Signed)
* * *        **  [name] [name], [name] [name]**    --- ---    76 Y old Female, DOB: Feb 22, 1996    [address] [place], [place] [address]    Home: [phone]    Provider: Lucille Passy, MD        * * *    Telephone Encounter    ---    Answered by   Sondra Barges  Date: 10/04/2017         Time: 01:41 PM    Reason   appt.    --- ---            Message                      called and left a VM informing patient we cancelled her appt. on 2/19 and to call back to reschedule her appt.                Action Taken   Situ,Cindy 10/04/2017 1:41:53 PM >                * * *                ---          * * *          Patient: [name] [name], [name] [name] DOB: 04-Apr-1996 Provider: Lucille Passy,  MD 10/04/2017    ---    Note generated by eClinicalWorks EMR/PM Software ([email or web address])

## 2020-12-21 NOTE — Progress Notes (Signed)
* * *        **  [name] [name], [name] [name]**    --- ---    15 Y old Female, DOB: 1996-02-18    [address] [place], [place] [address]    Home: [phone]    Provider: Consuella Lose        * * *    Telephone Encounter    ---    Answered by   Consuella Lose  Date: 08/23/2017         Time: 01:08 PM    Message                      Hi Dr Revonda Standard            I wanted to let you know I saw [name] today for nutrition assessment in the setting of her new diarrhea/abdominal pain.  I saw that her celiac serologies came back negative; she is quite compliant on her gluten free diet- we did go over a few logistical things (like having her own toaster).  From an IBS standpoint, her diet is actually very clean and very consistent (she eats very similarly day to day); there are virtually no foods from the high FODMAP food list that I could isolate as issues.  She avoids all other typical IBS triggers as well.  I am going to have her keep a 3 day food and GI log to see if I can gather any connectiosn, but I don't feel confident that her symptoms are food related.  Let me know your thoughts and if you'd like me to provide any further counseling.  Teniyah is going to be in touch with me over email.        --- ---            Action Taken   Lucille Passy, MD 08/23/2017 2:26:17 PM > Lucille Passy, MD 08/23/2017 4:50:07 PM > Given this info, I will have her do a course of  Rifaximin for IBS-D            Refills  Start Rifaximin Tablet, 550 MG, Orally, 30 Tablet, 1 tablet, Three  times a day, 10 days, Refills=0    --- ---          * * *                ---          * * *          Patient: [name], [name] DOB: [birth year] Provider: Consuella Lose  08/23/2017    ---    Note generated by eClinicalWorks EMR/PM Software ([email or web address])

## 2020-12-21 NOTE — Progress Notes (Signed)
* * *        **  Bifulco, Jaionna**    --- ---    58 Y old Female, DOB: 05-26-1996    [address] [place], [place] [address]    Home: [phone]    Provider: Consuella Lose        * * *    Web Encounter    ---    Answered by   Orson Gear  Date: 07/02/2018         Time: 03:32 PM    Caller   Feige Hursey    --- ---            Reason   New Appointment Request            Message                      Appointment Type: Follow up        Facility: [facility]      Provider: Consuella Lose       Date Range  From: 07/09/2018    To: 07/26/2018      Day         First Preference: Friday    Second Preference:Tuesday      Time        First Preference: Morning (8-12AM)    Second Preference:Afternoon(1-3PM)      Preferred Method of Contact: eMail      Email: [email or web address]      Contact Number: [phone]      Reason Of Visit: Stomach Pain / Diarrhea / Celiac Disease                       Action Taken                      Action - pt telephoned.  Left message                    * * *                ---          * * *          Patient: Chuba, Nakeita DOB: Mar 11, 1996 Provider: Consuella Lose  07/02/2018    ---    Note generated by eClinicalWorks EMR/PM Software ([email or web address])

## 2020-12-21 NOTE — Progress Notes (Signed)
* * *        **  [name], [name]**    --- ---    70 Y old Female, DOB: November 07, 1995    [address], [place], [place] [address]    Home: [phone]    Provider: Lucille Passy, MD        * * *    Telephone Encounter    ---    Answered by   Sondra Barges  Date: 08/20/2017         Time: 01:00 PM    Caller   patient's mother    --- ---            Reason   call back from MD            Message                      Hi Dr. Revonda Standard, I got a voice mail from the patient's mother and she stated that the patient got her blood test results and would like to know if it shows a celiac flare up. She also wanted you to know that the breath test is not covered by insurance but the mother will pay out of pocket. Best contact number is [phone]. Thank you, Arline Asp                Action Taken   Situ,Cindy 08/20/2017 1:02:17 PM > Lucille Passy, MD  08/20/2017 3:58:00 PM > Ms. Stiverson is now an adult and does not have any  cognitive or other impairment that requires me to communicate with her mother.  I called Ms. Kommer and left a message on her phone that the tests showed  excellent control of her celiac disease.                * * *                ---          * * *          Patient: [name] [name], [name] [name] DOB: 05-08-1996 Provider: Lucille Passy,  MD 08/20/2017    ---    Note generated by eClinicalWorks EMR/PM Software ([email or web address])

## 2020-12-21 NOTE — Progress Notes (Signed)
* * *        **  Hodge, Kalleigh**    --- ---    13 Y old Female, DOB: 09-18-1996    [address], [place], [place] [address]    Home: [phone]    Provider: Genia Plants, MD        * * *    Web Encounter    ---    Answered by   Dorothy Spark  Date: 04/11/2016         Time: 01:33 PM    Caller   Arwyn Bines    --- ---            Reason   Cancel Appointment Request            Message                      Rescheduled From:      Date: 04/11/2016  Time: 2:20 PM Reason: NP PREV DR NEY PHYSICAL      Provider: Genia Plants Facility:[facility]       ------------------------------------------       Rescheduled To:      Appointment Type: Consult            Facility: [facility]      Provider: Genia Plants       Date Range  From: 04/23/2016    To: 04/28/2016      Day         First Preference: Tuesday    Second Preference:Thursday      Time        First Preference: Morning (8-12AM)    Second Preference:Morning (8-12AM)      Preferred Method of Contact: eMail      Email: [email or web address]      Contact Number: [phone]      Reason Of Visit: NP PREV DR NEY PHYSICAL       Reason For Reschedule:                 Action Taken   [facility] 04/12/2016 9:40:38 AM >                * * *                ---          * * *          Patient: Tollett, Yanin DOB: 1996/05/16 Provider: Genia Plants, MD  04/11/2016    ---    Note generated by eClinicalWorks EMR/PM Software ([email or web address])

## 2020-12-21 NOTE — Progress Notes (Signed)
* * *        **  [name], [name]**    --- ---    33 Y old Female, DOB: 1995/12/28    [address], [place], [place] [address]    Home: [phone]    Provider: Tharon Aquas, MD        * * *    Telephone Encounter    ---    Answered by   Albesa Seen  Date: 02/29/2016         Time: 12:12 PM    Message                      CALLED AND LEFT MESSAGE FOR PT TO RESCHEDULE APPT THAT WAS CANCELLED ON 03/20/16 DUE TO DR. Franchot Heidelberg BEING ON VACATION.        --- ---            Action Taken   [facility] 03/07/2016 11:57:37 AM > LVM Baxter,Leanne  03/09/2016 10:46:47 AM > Rescheduled with Dr. Octaviano Glow                * * *                ---          * * *          Patient: [name] [name], [name] [name] DOB: [birth year] Provider: Tharon Aquas, MD  02/29/2016    ---    Note generated by eClinicalWorks EMR/PM Software ([email or web address])
